# Patient Record
Sex: Male | Born: 1965 | Race: Black or African American | Hispanic: No | Smoking: Former smoker
Health system: Southern US, Community
[De-identification: ages and names within clinical notes are randomized; demographics above are authoritative.]

## PROBLEM LIST (undated history)

## (undated) DIAGNOSIS — E781 Pure hyperglyceridemia: Secondary | ICD-10-CM

## (undated) DIAGNOSIS — M1712 Unilateral primary osteoarthritis, left knee: Secondary | ICD-10-CM

## (undated) DIAGNOSIS — E785 Hyperlipidemia, unspecified: Secondary | ICD-10-CM

## (undated) HISTORY — DX: Pure hyperglyceridemia: E78.1

## (undated) HISTORY — PX: ACHILLES TENDON REPAIR: SUR1153

## (undated) HISTORY — DX: Hyperlipidemia, unspecified: E78.5

## (undated) HISTORY — PX: APPENDECTOMY: SHX54

## (undated) HISTORY — DX: Unilateral primary osteoarthritis, left knee: M17.12

## (undated) HISTORY — PX: HERNIA REPAIR: SHX51

---

## 2000-05-24 ENCOUNTER — Emergency Department (HOSPITAL_COMMUNITY): Admission: EM | Admit: 2000-05-24 | Discharge: 2000-05-25 | Payer: Self-pay | Admitting: Emergency Medicine

## 2007-02-15 ENCOUNTER — Ambulatory Visit: Payer: Self-pay | Admitting: Internal Medicine

## 2007-02-15 LAB — CONVERTED CEMR LAB
AST: 25 units/L (ref 0–37)
Basophils Absolute: 0 10*3/uL (ref 0.0–0.1)
Basophils Relative: 0.6 % (ref 0.0–1.0)
Bilirubin Urine: NEGATIVE
Bilirubin, Direct: 0.1 mg/dL (ref 0.0–0.3)
CO2: 30 meq/L (ref 19–32)
Cholesterol: 165 mg/dL (ref 0–200)
Creatinine, Ser: 1.4 mg/dL (ref 0.4–1.5)
Crystals: NEGATIVE
Glucose, Bld: 102 mg/dL — ABNORMAL HIGH (ref 70–99)
HDL: 32.7 mg/dL — ABNORMAL LOW (ref >39.0)
Hemoglobin: 14.2 g/dL (ref 13.0–17.0)
Lymphocytes Relative: 37.4 % (ref 12.0–46.0)
MCHC: 34.8 g/dL (ref 30.0–36.0)
Monocytes Absolute: 0.3 10*3/uL (ref 0.2–0.7)
Monocytes Relative: 6.6 % (ref 3.0–11.0)
Neutro Abs: 2.1 10*3/uL (ref 1.4–7.7)
Nitrite: NEGATIVE
PSA: 0.51 ng/mL (ref 0.10–4.00)
Potassium: 3.9 meq/L (ref 3.5–5.1)
Sodium: 139 meq/L (ref 135–145)
Specific Gravity, Urine: 1.025 (ref 1.000–1.03)
TSH: 0.97 microintl units/mL (ref 0.35–5.50)
Total Bilirubin: 1.3 mg/dL — ABNORMAL HIGH (ref 0.3–1.2)
Total Protein, Urine: NEGATIVE mg/dL
Total Protein: 7.4 g/dL (ref 6.0–8.3)
Urine Glucose: NEGATIVE mg/dL
Urobilinogen, UA: 0.2 (ref 0.0–1.0)

## 2007-02-22 ENCOUNTER — Ambulatory Visit: Payer: Self-pay | Admitting: Internal Medicine

## 2008-02-19 ENCOUNTER — Ambulatory Visit: Payer: Self-pay | Admitting: Internal Medicine

## 2009-04-18 ENCOUNTER — Ambulatory Visit: Payer: Self-pay | Admitting: Internal Medicine

## 2009-04-18 DIAGNOSIS — M25569 Pain in unspecified knee: Secondary | ICD-10-CM | POA: Insufficient documentation

## 2009-04-22 ENCOUNTER — Encounter: Payer: Self-pay | Admitting: Internal Medicine

## 2009-05-02 ENCOUNTER — Telehealth (INDEPENDENT_AMBULATORY_CARE_PROVIDER_SITE_OTHER): Payer: Self-pay | Admitting: *Deleted

## 2009-05-07 ENCOUNTER — Ambulatory Visit: Payer: Self-pay | Admitting: Internal Medicine

## 2009-05-07 LAB — CONVERTED CEMR LAB: Creatinine, Ser: 1.5 mg/dL (ref 0.4–1.5)

## 2010-01-19 ENCOUNTER — Ambulatory Visit: Payer: Self-pay | Admitting: Internal Medicine

## 2010-02-17 ENCOUNTER — Encounter: Payer: Self-pay | Admitting: Internal Medicine

## 2010-08-30 LAB — CONVERTED CEMR LAB
ALT: 37 units/L (ref 0–53)
AST: 25 units/L (ref 0–37)
Albumin: 4.1 g/dL (ref 3.5–5.2)
Albumin: 4.3 g/dL (ref 3.5–5.2)
Alkaline Phosphatase: 59 units/L (ref 39–117)
Alkaline Phosphatase: 68 units/L (ref 39–117)
BUN: 10 mg/dL (ref 6–23)
BUN: 15 mg/dL (ref 6–23)
Bacteria, UA: NEGATIVE
Basophils Absolute: 0 10*3/uL (ref 0.0–0.1)
Basophils Relative: 0.7 % (ref 0.0–3.0)
Bilirubin Urine: NEGATIVE
CO2: 28 meq/L (ref 19–32)
Calcium: 9.3 mg/dL (ref 8.4–10.5)
Chloride: 106 meq/L (ref 96–112)
Cholesterol: 159 mg/dL (ref 0–200)
Creatinine, Ser: 1.5 mg/dL (ref 0.4–1.5)
Creatinine, Ser: 1.6 mg/dL — ABNORMAL HIGH (ref 0.4–1.5)
Direct LDL: 84.9 mg/dL
Eosinophils Absolute: 0.2 10*3/uL (ref 0.0–0.7)
Eosinophils Absolute: 0.2 10*3/uL (ref 0.0–0.7)
Eosinophils Relative: 4.9 % (ref 0.0–5.0)
GFR calc non Af Amer: 61.52 mL/min (ref >60)
Glucose, Bld: 111 mg/dL — ABNORMAL HIGH (ref 70–99)
Glucose, Bld: 87 mg/dL (ref 70–99)
HCT: 42.2 % (ref 39.0–52.0)
HDL: 29.6 mg/dL — ABNORMAL LOW (ref >39.00)
Hemoglobin: 14.9 g/dL (ref 13.0–17.0)
Ketones, ur: NEGATIVE mg/dL
Lymphocytes Relative: 38.4 % (ref 12.0–46.0)
MCHC: 34.4 g/dL (ref 30.0–36.0)
MCV: 89.9 fL (ref 78.0–100.0)
Monocytes Absolute: 0.3 10*3/uL (ref 0.1–1.0)
Monocytes Relative: 6.4 % (ref 3.0–12.0)
Monocytes Relative: 6.7 % (ref 3.0–12.0)
Mucus, UA: NEGATIVE
Neutro Abs: 2.5 10*3/uL (ref 1.4–7.7)
Neutrophils Relative %: 45.7 % (ref 43.0–77.0)
Nitrite: NEGATIVE
PSA: 0.55 ng/mL (ref 0.10–4.00)
Platelets: 116 10*3/uL — ABNORMAL LOW (ref 150.0–400.0)
Platelets: 126 10*3/uL — ABNORMAL LOW (ref 150–400)
Potassium: 4.2 meq/L (ref 3.5–5.1)
RBC: 4.7 M/uL (ref 4.22–5.81)
RDW: 13 % (ref 11.5–14.6)
Sodium: 141 meq/L (ref 135–145)
Specific Gravity, Urine: 1.02 (ref 1.000–1.03)
Total Bilirubin: 1 mg/dL (ref 0.3–1.2)
Total CHOL/HDL Ratio: 5
Total Protein, Urine: NEGATIVE mg/dL
Total Protein, Urine: NEGATIVE mg/dL
Total Protein: 7 g/dL (ref 6.0–8.3)
Urine Glucose: NEGATIVE mg/dL
VLDL: 49.8 mg/dL — ABNORMAL HIGH (ref 0.0–40.0)
WBC: 4.6 10*3/uL (ref 4.5–10.5)
pH: 6 (ref 5.0–8.0)

## 2010-09-01 NOTE — Assessment & Plan Note (Signed)
Summary: left knee pain/cd   Vital Signs:  Patient profile:   45 year old male Height:      72 inches Weight:      251.25 pounds BMI:     34.20 O2 Sat:      96 % on Room air Temp:     98.7 degrees F oral Pulse rate:   84 / minute BP sitting:   122 / 80  (left arm) Cuff size:   large  Vitals Entered ByMarland Kitchen Zella Ball Ewing (January 19, 2010 3:40 PM)  O2 Flow:  Room air CC: Left Knee pain/RE   CC:  Left Knee pain/RE.  History of Present Illness: here with 2 months onset mod to severe gradually worsening left knee pain, sweling and click occasionally;  started after playing football with son;  no fever, trauma, hx of gout, fall ;  Pt denies CP, sob, doe, wheezing, orthopnea, pnd, worsening LE edema, palps, dizziness or syncope   Pt denies new neuro symptoms such as headache, facial or extremity weakness  No prior hx of left knee problem, pain, sweling or surgury in past  Problems Prior to Update: 1)  Tb Skin Test, Positive  (ICD-795.5) 2)  Knee Pain, Left  (ICD-719.46) 3)  Preventive Health Care  (ICD-V70.0)  Medications Prior to Update: 1)  None  Current Medications (verified): 1)  Tramadol Hcl 50 Mg Tabs (Tramadol Hcl) .Marland Kitchen.. 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   Head:  normocephalic and atraumatic.   Eyes:  vision grossly intact, pupils equal, and pupils round.     Ears:  R ear normal and L ear normal.   Nose:  no external deformity and no nasal discharge.   Mouth:  no gingival abnormalities and pharynx pink and moist.   Neck:  supple and no masses.   Lungs:  normal respiratory effort and normal breath sounds.   Heart:  normal rate and regular rhythm.   Msk:  right knee FROM, NT and no efusion;  left knee with 2-3+ effusion, tender anteriorly and medially with click on ROM Extremities:  no edema, no erythema  Neurologic:  strength normal in all extremities and gait normal.  except somewhat favors the left knee   Impression & Recommendations:  Problem # 1:  KNEE PAIN, LEFT (ICD-719.46)  with effusion, persistent and worsening pain now mod to severe , with click occasional - suspect cartilage tear; for tramadol as needed, MRI left knee, and refer ortho -  Orders: Orthopedic Surgeon Referral (Ortho Surgeon) Radiology Referral (Radiology)  His updated medication list for this problem includes:    Tramadol Hcl 50 Mg Tabs (Tramadol hcl) .Marland Kitchen... 1-2 by mouth q 6 hrs as needed pain  Complete Medication List: 1)  Tramadol Hcl 50 Mg Tabs (Tramadol hcl) .Marland Kitchen.. 1-2 by mouth q 6 hrs as needed pain  Patient Instructions: 1)  Please take all new medications as prescribed 2)  Continue all previous medications as before this visit  3)  You will be contacted about the referral(s) to: MRI and orthopedic evaluations 4)  Please schedule a follow-up appointment as needed. Prescriptions: TRAMADOL HCL 50 MG TABS (TRAMADOL HCL) 1-2 by mouth q 6 hrs as needed pain  #60 x 2   Entered and Authorized by:   Corwin Levins MD   Signed by:   Corwin Levins MD on 01/19/2010   Method used:   Print then Give to Patient   RxID:   [id number]

## 2010-09-01 NOTE — Letter (Signed)
Summary: [facility]  [facility]   Imported By: Sherian Rein 02/26/2010 09:05:22  _____________________________________________________________________  External Attachment:    Type:   Image     Comment:   External Document

## 2011-11-22 ENCOUNTER — Ambulatory Visit (INDEPENDENT_AMBULATORY_CARE_PROVIDER_SITE_OTHER): Payer: 59 | Admitting: Internal Medicine

## 2011-11-22 ENCOUNTER — Other Ambulatory Visit (INDEPENDENT_AMBULATORY_CARE_PROVIDER_SITE_OTHER): Payer: 59

## 2011-11-22 ENCOUNTER — Encounter: Payer: Self-pay | Admitting: Internal Medicine

## 2011-11-22 VITALS — BP 102/62 | HR 73 | Temp 97.8°F | Ht 71.5 in | Wt 254.4 lb

## 2011-11-22 DIAGNOSIS — R350 Frequency of micturition: Secondary | ICD-10-CM

## 2011-11-22 DIAGNOSIS — Z0000 Encounter for general adult medical examination without abnormal findings: Secondary | ICD-10-CM

## 2011-11-22 DIAGNOSIS — A64 Unspecified sexually transmitted disease: Secondary | ICD-10-CM

## 2011-11-22 DIAGNOSIS — A6000 Herpesviral infection of urogenital system, unspecified: Secondary | ICD-10-CM | POA: Insufficient documentation

## 2011-11-22 DIAGNOSIS — E781 Pure hyperglyceridemia: Secondary | ICD-10-CM

## 2011-11-22 DIAGNOSIS — M1712 Unilateral primary osteoarthritis, left knee: Secondary | ICD-10-CM

## 2011-11-22 HISTORY — DX: Pure hyperglyceridemia: E78.1

## 2011-11-22 HISTORY — DX: Unilateral primary osteoarthritis, left knee: M17.12

## 2011-11-22 LAB — CBC WITH DIFFERENTIAL/PLATELET
Basophils Absolute: 0 10*3/uL (ref 0.0–0.1)
Eosinophils Absolute: 0.2 10*3/uL (ref 0.0–0.7)
Hemoglobin: 14.9 g/dL (ref 13.0–17.0)
Lymphocytes Relative: 39 % (ref 12.0–46.0)
MCHC: 33.8 g/dL (ref 30.0–36.0)
Monocytes Relative: 7.8 % (ref 3.0–12.0)
Neutro Abs: 2.1 10*3/uL (ref 1.4–7.7)
Neutrophils Relative %: 48.9 % (ref 43.0–77.0)
RBC: 4.86 Mil/uL (ref 4.22–5.81)
RDW: 13.6 % (ref 11.5–14.6)

## 2011-11-22 LAB — URINALYSIS, ROUTINE W REFLEX MICROSCOPIC
Leukocytes, UA: NEGATIVE
Nitrite: NEGATIVE
Specific Gravity, Urine: 1.02 (ref 1.000–1.030)
Urobilinogen, UA: 0.2 (ref 0.0–1.0)

## 2011-11-22 MED ORDER — CIPROFLOXACIN HCL 500 MG PO TABS: 500.0000 mg | ORAL_TABLET | Freq: Two times a day (BID) | ORAL | Status: AC

## 2011-11-22 NOTE — Assessment & Plan Note (Signed)
By hx has minor penile d/c;  For routine labs and urine cx; overall I suspect prostatitis though DRE exam not overwhelming with his urinary freq - for cipro course, consider urology if nor improved

## 2011-11-22 NOTE — Progress Notes (Signed)
Subjective:    Patient ID: Ryan Dean, male    DOB: 01/07/1966, 46 y.o.   MRN: [id number]  HPI  Here for wellness and f/u;  Overall doing ok;  Pt denies CP, worsening SOB, DOE, wheezing, orthopnea, PND, worsening LE edema, palpitations, dizziness or syncope.  Pt denies neurological change such as new Headache, facial or extremity weakness.  Pt denies polydipsia, polyuria, or low sugar symptoms. Pt states overall good compliance with treatment and medications, good tolerability, and trying to follow lower cholesterol diet.  Pt denies worsening depressive symptoms, suicidal ideation or panic. No fever, wt loss, night sweats, loss of appetite, or other constitutional symptoms.  Pt states good ability with ADL's, low fall risk, home safety reviewed and adequate, no significant changes in hearing or vision, and occasionally active with exercise.  Has ongoing left knee pain with recurrent swelling only after exercise, o/w able to work, and declines ortho.  Also with minor penile d/c at end urination assoc with unsusual LBP and urinary freq for 2 wks.   Past Medical History  Diagnosis Date  . Hypertriglyceridemia 11/22/2011  . Degenerative arthritis of left knee 11/22/2011   Past Surgical History  Procedure Date  . Achilles tendon repair     left, 2004  . Appendectomy   . Hernia repair     umbilical as child    reports that he has never smoked. He has never used smokeless tobacco. He reports that he drinks alcohol. He reports that he does not use illicit drugs. family history includes Cancer in his father. No Known Allergies No current outpatient prescriptions on file prior to visit.    Review of Systems Review of Systems  Constitutional: Negative for diaphoresis, activity change, appetite change and unexpected weight change.  HENT: Negative for hearing loss, ear pain, facial swelling, mouth sores and neck stiffness.   Eyes: Negative for pain, redness and visual disturbance.  Respiratory:  Negative for shortness of breath and wheezing.   Cardiovascular: Negative for chest pain and palpitations.  Gastrointestinal: Negative for diarrhea, blood in stool, abdominal distention and rectal pain.  Genitourinary: Negative for hematuria, flank pain and decreased urine volume.  Musculoskeletal: Negative for myalgias and joint swelling.  Skin: Negative for color change and wound.  Neurological: Negative for syncope and numbness.  Hematological: Negative for adenopathy.  Psychiatric/Behavioral: Negative for hallucinations, self-injury, decreased concentration and agitation.      Objective:   Physical Exam BP 102/62  Pulse 73  Temp(Src) 97.8 F (36.6 C) (Oral)  Ht 5' 11.5" (1.816 m)  Wt 254 lb 6 oz (115.384 kg)  BMI 34.98 kg/m2  SpO2 97% Physical Exam  VS noted Constitutional: Pt is oriented to person, place, and time. Appears well-developed and well-nourished.  HENT:  Head: Normocephalic and atraumatic.  Right Ear: External ear normal.  Left Ear: External ear normal.  Nose: Nose normal.  Mouth/Throat: Oropharynx is clear and moist.  Eyes: Conjunctivae and EOM are normal. Pupils are equal, round, and reactive to light.  Neck: Normal range of motion. Neck supple. No JVD present. No tracheal deviation present.  Cardiovascular: Normal rate, regular rhythm, normal heart sounds and intact distal pulses.   Pulmonary/Chest: Effort normal and breath sounds normal.  Abdominal: Soft. Bowel sounds are normal. There is no tenderness.  Musculoskeletal: Normal range of motion. Exhibits no edema.  Lymphadenopathy:  Has no cervical adenopathy.  Neurological: Pt is alert and oriented to person, place, and time. Pt has normal reflexes. No cranial nerve deficit.  Skin:  Skin is warm and dry. No rash noted.  Psychiatric:  Has  normal mood and affect. Behavior is normal.  DRE:  Mild tender prostate, not enlarged, no nodule , no rectal mass, heme neg Spine nontender    Assessment & Plan:

## 2011-11-22 NOTE — Patient Instructions (Signed)
Take all new medications as prescribed Continue all other medications as before Please go to LAB in the Basement for the blood and/or urine tests to be done today You will be contacted by phone if any changes need to be made immediately.  Otherwise, you will receive a letter about your results with an explanation. Please return in 1 year for your yearly visit, or sooner if needed, with Lab testing done 3-5 days before

## 2011-11-22 NOTE — Assessment & Plan Note (Signed)
?   Etiology - suspect prostatitis, for cipro course, urine studies

## 2011-11-22 NOTE — Assessment & Plan Note (Signed)

## 2011-11-23 ENCOUNTER — Encounter: Payer: Self-pay | Admitting: Internal Medicine

## 2011-11-23 LAB — BASIC METABOLIC PANEL
CO2: 30 mEq/L (ref 19–32)
Calcium: 10.3 mg/dL (ref 8.4–10.5)
Chloride: 105 mEq/L (ref 96–112)
Creatinine, Ser: 1.5 mg/dL (ref 0.4–1.5)
Glucose, Bld: 86 mg/dL (ref 70–99)

## 2011-11-23 LAB — HEPATIC FUNCTION PANEL
ALT: 27 U/L (ref 0–53)
Albumin: 4.4 g/dL (ref 3.5–5.2)
Alkaline Phosphatase: 59 U/L (ref 39–117)
Bilirubin, Direct: 0.1 mg/dL (ref 0.0–0.3)
Total Protein: 7.6 g/dL (ref 6.0–8.3)

## 2011-11-23 LAB — HIV ANTIBODY (ROUTINE TESTING W REFLEX): HIV: NONREACTIVE

## 2011-11-23 LAB — LIPID PANEL
HDL: 33.7 mg/dL — ABNORMAL LOW (ref >39.00)
Triglycerides: 197 mg/dL — ABNORMAL HIGH (ref 0.0–149.0)

## 2011-11-23 LAB — GC/CHLAMYDIA PROBE AMP, URINE: GC Probe Amp, Urine: NEGATIVE

## 2011-11-23 LAB — HEPATITIS PANEL, ACUTE
HCV Ab: NEGATIVE
Hep A IgM: NEGATIVE
Hepatitis B Surface Ag: NEGATIVE

## 2011-11-23 LAB — HSV 2 ANTIBODY, IGG: HSV 2 Glycoprotein G Ab, IgG: 9 IV — ABNORMAL HIGH

## 2011-11-24 ENCOUNTER — Telehealth: Payer: Self-pay | Admitting: Internal Medicine

## 2011-11-24 LAB — URINE CULTURE: Colony Count: NO GROWTH

## 2011-11-24 MED ORDER — VALACYCLOVIR HCL 500 MG PO TABS: 500.0000 mg | ORAL_TABLET | Freq: Every day | ORAL | Status: AC

## 2011-11-24 NOTE — Telephone Encounter (Signed)
Message copied by Corwin Levins on Wed Nov 24, 2011 11:58 AM ------      Message from: Scharlene Gloss B      Created: Wed Nov 24, 2011  8:39 AM       Called the patient informed of results. The patient would like the medication for valtrex sent to [facility] [place] [address] please.

## 2011-11-24 NOTE — Telephone Encounter (Signed)
Done per emr 

## 2013-08-30 ENCOUNTER — Encounter: Payer: 59 | Admitting: Internal Medicine

## 2014-02-06 ENCOUNTER — Ambulatory Visit: Payer: 59 | Admitting: Internal Medicine

## 2014-02-22 ENCOUNTER — Ambulatory Visit (INDEPENDENT_AMBULATORY_CARE_PROVIDER_SITE_OTHER): Payer: 59 | Admitting: Internal Medicine

## 2014-02-22 ENCOUNTER — Ambulatory Visit (INDEPENDENT_AMBULATORY_CARE_PROVIDER_SITE_OTHER)
Admission: RE | Admit: 2014-02-22 | Discharge: 2014-02-22 | Disposition: A | Discharge status: Home / Self Care | Payer: 59 | Source: Ambulatory Visit | Attending: Internal Medicine | Admitting: Internal Medicine

## 2014-02-22 ENCOUNTER — Encounter: Payer: Self-pay | Admitting: Internal Medicine

## 2014-02-22 ENCOUNTER — Other Ambulatory Visit (INDEPENDENT_AMBULATORY_CARE_PROVIDER_SITE_OTHER): Payer: 59

## 2014-02-22 VITALS — BP 128/82 | HR 60 | Temp 98.7°F | Ht 71.5 in | Wt 241.2 lb

## 2014-02-22 DIAGNOSIS — R0789 Other chest pain: Secondary | ICD-10-CM

## 2014-02-22 DIAGNOSIS — Z0000 Encounter for general adult medical examination without abnormal findings: Secondary | ICD-10-CM

## 2014-02-22 DIAGNOSIS — R079 Chest pain, unspecified: Secondary | ICD-10-CM | POA: Insufficient documentation

## 2014-02-22 DIAGNOSIS — E785 Hyperlipidemia, unspecified: Secondary | ICD-10-CM

## 2014-02-22 LAB — CBC WITH DIFFERENTIAL/PLATELET
BASOS ABS: 0 10*3/uL (ref 0.0–0.1)
Basophils Relative: 0.4 % (ref 0.0–3.0)
EOS ABS: 0.2 10*3/uL (ref 0.0–0.7)
Eosinophils Relative: 4.3 % (ref 0.0–5.0)
HEMATOCRIT: 41.7 % (ref 39.0–52.0)
Hemoglobin: 14.4 g/dL (ref 13.0–17.0)
LYMPHS ABS: 1.7 10*3/uL (ref 0.7–4.0)
Lymphocytes Relative: 34.2 % (ref 12.0–46.0)
MCHC: 34.6 g/dL (ref 30.0–36.0)
MCV: 89.1 fl (ref 78.0–100.0)
MONO ABS: 0.3 10*3/uL (ref 0.1–1.0)
Monocytes Relative: 6.7 % (ref 3.0–12.0)
NEUTROS PCT: 54.4 % (ref 43.0–77.0)
Neutro Abs: 2.7 10*3/uL (ref 1.4–7.7)
PLATELETS: 140 10*3/uL — AB (ref 150.0–400.0)
RBC: 4.68 Mil/uL (ref 4.22–5.81)
RDW: 13.9 % (ref 11.5–15.5)
WBC: 5 10*3/uL (ref 4.0–10.5)

## 2014-02-22 LAB — URINALYSIS, ROUTINE W REFLEX MICROSCOPIC
BILIRUBIN URINE: NEGATIVE
Hgb urine dipstick: NEGATIVE
Leukocytes, UA: NEGATIVE
NITRITE: NEGATIVE
Specific Gravity, Urine: 1.015 (ref 1.000–1.030)
TOTAL PROTEIN, URINE-UPE24: NEGATIVE
Urine Glucose: NEGATIVE
Urobilinogen, UA: 0.2 (ref 0.0–1.0)
pH: 7.5 (ref 5.0–8.0)

## 2014-02-22 LAB — BASIC METABOLIC PANEL
BUN: 13 mg/dL (ref 6–23)
CO2: 25 mEq/L (ref 19–32)
Calcium: 9.3 mg/dL (ref 8.4–10.5)
Chloride: 105 mEq/L (ref 96–112)
Creatinine, Ser: 1.5 mg/dL (ref 0.4–1.5)
GFR: 66.32 mL/min (ref >60.00)
Glucose, Bld: 88 mg/dL (ref 70–99)
POTASSIUM: 3.8 meq/L (ref 3.5–5.1)
SODIUM: 137 meq/L (ref 135–145)

## 2014-02-22 LAB — LIPID PANEL
CHOL/HDL RATIO: 5
Cholesterol: 146 mg/dL (ref 0–200)
HDL: 31.7 mg/dL — ABNORMAL LOW (ref >39.00)
LDL CALC: 75 mg/dL (ref 0–99)
NonHDL: 114.3
TRIGLYCERIDES: 195 mg/dL — AB (ref 0.0–149.0)
VLDL: 39 mg/dL (ref 0.0–40.0)

## 2014-02-22 LAB — HEPATIC FUNCTION PANEL
ALK PHOS: 67 U/L (ref 39–117)
ALT: 26 U/L (ref 0–53)
AST: 25 U/L (ref 0–37)
Albumin: 4.3 g/dL (ref 3.5–5.2)
BILIRUBIN DIRECT: 0.1 mg/dL (ref 0.0–0.3)
BILIRUBIN TOTAL: 0.7 mg/dL (ref 0.2–1.2)
Total Protein: 7.2 g/dL (ref 6.0–8.3)

## 2014-02-22 NOTE — Progress Notes (Signed)
Subjective:    Patient ID: Ryan Dean, male    DOB: 05-Nov-1965, 48 y.o.   MRN: [id number]  HPI  Here after lost to f/u since 2013, Here for wellness and f/u;  Overall doing ok;  Pt denies worsening SOB, DOE, wheezing, orthopnea, PND, worsening LE edema, palpitations, dizziness or syncope, but has been having lower mid chest discomfort, sometimes sharp, sometimes dull or burning, last 1-2 min each time, recurrent several times per week for 2 mo.  Has been lifting wts and o/w more active since 2013.  Also does pushups most days of the week. Smokes occas cigars, has hx of very mild elev ldl, but no DM, HTN,    Pt denies neurological change such as new headache, facial or extremity weakness.  Pt denies polydipsia, polyuria, or low sugar symptoms. Pt states overall good compliance with treatment and medications, good tolerability, and has been trying to follow lower cholesterol diet.  Pt denies worsening depressive symptoms, suicidal ideation or panic. No fever, night sweats, wt loss, loss of appetite, or other constitutional symptoms.  Pt states good ability with ADL's, has low fall risk, home safety reviewed and adequate, no other significant changes in hearing or vision, and only occasionally active with exercise. Past Medical History  Diagnosis Date  . Hypertriglyceridemia 11/22/2011  . 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 Achilles tendon repair      left, 2004  . Appendectomy    . Hernia repair      umbilical as child    reports that he has never smoked. He has never used smokeless tobacco. He reports that he drinks alcohol. He reports that he does not use illicit drugs. family history includes Cancer in his father. No Known Allergies No current outpatient prescriptions on file prior to visit.   No current facility-administered medications on file prior to visit.   Review of Systems Constitutional: Negative for increased  diaphoresis, other activity, appetite or other siginficant weight change  HENT: Negative for worsening hearing loss, ear pain, facial swelling, mouth sores and neck stiffness.   Eyes: Negative for other worsening pain, redness or visual disturbance.  Respiratory: Negative for shortness of breath and wheezing.   Cardiovascular: Negative for chest pain and palpitations.  Gastrointestinal: Negative for diarrhea, blood in stool, abdominal distention or other pain Genitourinary: Negative for hematuria, flank pain or change in urine volume.  Musculoskeletal: Negative for myalgias or other joint complaints.  Skin: Negative for color change and wound.  Neurological: Negative for syncope and numbness. other than noted Hematological: Negative for adenopathy. or other swelling Psychiatric/Behavioral: Negative for hallucinations, self-injury, decreased concentration or other worsening agitation.      Objective:   Physical Exam BP 128/82  Pulse 60  Temp(Src) 98.7 F (37.1 C) (Oral)  Ht 5' 11.5" (1.816 m)  Wt 241 lb 4 oz (109.43 kg)  BMI 33.18 kg/m2  SpO2 98% VS noted,  Constitutional: Pt is oriented to person, place, and time. Appears well-developed and well-nourished.  Head: Normocephalic and atraumatic.  Right Ear: External ear normal.  Left Ear: External ear normal.  Nose: Nose normal.  Mouth/Throat: Oropharynx is clear and moist.  Eyes: Conjunctivae and EOM are normal. Pupils are equal, round, and reactive to light.  Neck: Normal range of motion. Neck supple. No JVD present. No tracheal deviation present.  Cardiovascular: Normal rate, regular rhythm, normal heart sounds and intact distal pulses.   Pulmonary/Chest: Effort normal and  breath sounds without rales or wheezing  No chest wall or sternal tenderness. Abdominal: Soft. Bowel sounds are normal. NT. No HSM  Musculoskeletal: Normal range of motion. Exhibits no edema.  Lymphadenopathy:  Has no cervical adenopathy.  Neurological: Pt is  alert and oriented to person, place, and time. Pt has normal reflexes. No cranial nerve deficit. Motor grossly intact Skin: Skin is warm and dry. No rash noted.  Psychiatric:  Has normal mood and affect. Behavior is normal.     Assessment & Plan:

## 2014-02-22 NOTE — Progress Notes (Signed)
Pre visit review using our clinic review tool, if applicable. No additional management support is needed unless otherwise documented below in the visit note. 

## 2014-02-22 NOTE — Patient Instructions (Signed)
Your EKG was OK today  Please continue all other medications as before, and refills have been done if requested.  Please have the pharmacy call with any other refills you may need.  Please continue your efforts at being more active, low cholesterol diet, and weight control.  You are otherwise up to date with prevention measures today.  Please keep your appointments with your specialists as you may have planned  Please go to the XRAY Department in the Basement (go straight as you get off the elevator) for the x-ray testing  Please go to the LAB in the Basement (turn left off the elevator) for the tests to be done today  You will be contacted regarding the referral for: stress test  Please return in 1 year for your yearly visit, or sooner if needed

## 2014-02-24 ENCOUNTER — Encounter: Payer: Self-pay | Admitting: Internal Medicine

## 2014-02-24 DIAGNOSIS — E785 Hyperlipidemia, unspecified: Secondary | ICD-10-CM | POA: Insufficient documentation

## 2014-02-24 HISTORY — DX: Hyperlipidemia, unspecified: E78.5

## 2014-02-24 NOTE — Assessment & Plan Note (Signed)
Atypical, ECG reviewed as per emr, for cxr, also stress test given age and CRF's

## 2014-02-24 NOTE — Assessment & Plan Note (Signed)
Declines statin for now, for lower chol diet 

## 2014-02-24 NOTE — Assessment & Plan Note (Signed)

## 2014-02-25 LAB — TSH: TSH: 1.2 u[IU]/mL (ref 0.35–4.50)

## 2014-02-25 LAB — PSA: PSA: 0.86 ng/mL (ref 0.10–4.00)

## 2014-02-26 ENCOUNTER — Encounter: Payer: Self-pay | Admitting: Internal Medicine

## 2014-04-03 ENCOUNTER — Ambulatory Visit (HOSPITAL_COMMUNITY): Payer: 59 | Attending: Internal Medicine | Admitting: Radiology

## 2014-04-03 VITALS — BP 125/95 | HR 63 | Ht 71.0 in | Wt 235.0 lb

## 2014-04-03 DIAGNOSIS — R42 Dizziness and giddiness: Secondary | ICD-10-CM | POA: Diagnosis not present

## 2014-04-03 DIAGNOSIS — R9389 Abnormal findings on diagnostic imaging of other specified body structures: Secondary | ICD-10-CM | POA: Diagnosis not present

## 2014-04-03 DIAGNOSIS — R0789 Other chest pain: Secondary | ICD-10-CM | POA: Insufficient documentation

## 2014-04-03 DIAGNOSIS — R030 Elevated blood-pressure reading, without diagnosis of hypertension: Secondary | ICD-10-CM | POA: Insufficient documentation

## 2014-04-03 MED ORDER — TECHNETIUM TC 99M SESTAMIBI GENERIC - CARDIOLITE
11.0000 | Freq: Once | INTRAVENOUS | Status: AC | PRN
  Administered 2014-04-03: 11 via INTRAVENOUS

## 2014-04-03 MED ORDER — TECHNETIUM TC 99M SESTAMIBI GENERIC - CARDIOLITE
33.0000 | Freq: Once | INTRAVENOUS | Status: AC | PRN
  Administered 2014-04-03: 33 via INTRAVENOUS

## 2014-04-03 NOTE — Progress Notes (Signed)
[facility] SITE 3 NUCLEAR MED [address] [place], [place] [address] [phone]    Cardiology Nuclear Med Study  Rusty Villella is a 48 y.o. male     MRN : [id number]     DOB: [birth year]  Procedure Date: 04/03/2014  Nuclear Med Background Indication for Stress Test:  Evaluation for Ischemia History:  No known CAD Cardiac Risk Factors: Family History - CAD and Lipids  Symptoms:  Chest Pain (last date of chest discomfort was last night) and Dizziness   Nuclear Pre-Procedure Caffeine/Decaff Intake:  None> 12 hrs NPO After: 11:00pm   Lungs:  clear O2 Sat: 96% on room air. IV 0.9% NS with Angio Cath:  20g  IV Site: R Antecubital x 1, tolerated well IV Started by:  Irean Hong, RN  Chest Size (in):  46 Cup Size: n/a  Height:  (1.803 m)  Weight:  235 lb (106.595 kg)  BMI:  Body mass index is 32.79 kg/(m^2). Tech Comments:  N/A    Nuclear Med Study 1 or 2 day study: 1 day  Stress Test Type:  Stress  Reading MD: N/A  Order Authorizing Provider:  Oliver Barre, MD  Resting Radionuclide: Technetium 33m Sestamibi  Resting Radionuclide Dose: 11.0 mCi   Stress Radionuclide:  Technetium 18m Sestamibi  Stress Radionuclide Dose: 33.0 mCi           Stress Protocol Rest HR: 63 Stress HR: 162  Rest BP: 125/95 Stress BP: 172/118  Exercise Time (min): 10:15 METS: 12.3           Dose of Adenosine (mg):  n/a Dose of Lexiscan: n/a mg  Dose of Atropine (mg): n/a Dose of Dobutamine: n/a mcg/kg/min (at max HR)  Stress Test Technologist: Nelson Chimes, BS-ES  Nuclear Technologist:  Harlow Asa, CNMT     Rest Procedure:  Myocardial perfusion imaging was performed at rest 46 minutes following the intravenous administration of Technetium 46m Sestamibi. Rest ECG: NSR - Normal EKG  Stress Procedure:  The patient exercised on the treadmill utilizing the Bruce Protocol for 10:15 minutes. The patient stopped due to fatigue and denied any chest pain.  Technetium 30m Sestamibi  was injected at peak exercise and myocardial perfusion imaging was performed after a brief delay. Stress ECG: No significant change from baseline ECG  QPS Raw Data Images:  Normal; no motion artifact; normal heart/lung ratio. Stress Images:  There is a small area of mild attenuation of the mid-basal anterior wall with normal uptake in the other areas.   Rest Images:  There is a very small area of very mild attenuation of the mid-basal anterior wall.   Subtraction (SDS):  Therer is a very small , very mild reversible area of reversible attenuation .  SDS = 3 Transient Ischemic Dilatation (Normal <1.22):  1.00 Lung/Heart Ratio (Normal <0.45):  0.29  Quantitative Gated Spect Images QGS EDV:  142 ml QGS ESV:  74 ml  Impression Exercise Capacity:  Excellent exercise capacity. BP Response:  Hypertensive blood pressure response. Clinical Symptoms:  No significant symptoms noted. ECG Impression:  No significant ST segment change suggestive of ischemia. Comparison with Prior Nuclear Study: No images to compare  Overall Impression:  Low risk stress nuclear study .  There is a very mild basal - mid anterior defect that may be due to chest wall artifact.  .  LV Ejection Fraction: 48%.  LV Wall Motion:  There is mild global LV hypokinesis.       Deloris Ping.  Fran Lowes., MD, [facility] 04/03/2014, 4:39 PM [address]  [address] Office [phone] Pager [phone]

## 2014-04-04 ENCOUNTER — Encounter: Payer: Self-pay | Admitting: Internal Medicine

## 2015-07-25 ENCOUNTER — Encounter: Payer: Self-pay | Admitting: Internal Medicine

## 2015-07-25 ENCOUNTER — Ambulatory Visit (INDEPENDENT_AMBULATORY_CARE_PROVIDER_SITE_OTHER): Payer: 59 | Admitting: Internal Medicine

## 2015-07-25 ENCOUNTER — Other Ambulatory Visit (INDEPENDENT_AMBULATORY_CARE_PROVIDER_SITE_OTHER): Payer: 59

## 2015-07-25 VITALS — BP 130/72 | HR 63 | Temp 97.8°F | Resp 12 | Ht 71.0 in | Wt 244.0 lb

## 2015-07-25 DIAGNOSIS — Z0000 Encounter for general adult medical examination without abnormal findings: Secondary | ICD-10-CM

## 2015-07-25 DIAGNOSIS — N529 Male erectile dysfunction, unspecified: Secondary | ICD-10-CM | POA: Diagnosis not present

## 2015-07-25 DIAGNOSIS — R7989 Other specified abnormal findings of blood chemistry: Secondary | ICD-10-CM | POA: Diagnosis not present

## 2015-07-25 LAB — CBC WITH DIFFERENTIAL/PLATELET
BASOS ABS: 0 10*3/uL (ref 0.0–0.1)
Basophils Relative: 0.7 % (ref 0.0–3.0)
EOS ABS: 0.2 10*3/uL (ref 0.0–0.7)
Eosinophils Relative: 4.8 % (ref 0.0–5.0)
HEMATOCRIT: 44.2 % (ref 39.0–52.0)
HEMOGLOBIN: 14.7 g/dL (ref 13.0–17.0)
LYMPHS PCT: 41.2 % (ref 12.0–46.0)
Lymphs Abs: 2 10*3/uL (ref 0.7–4.0)
MCHC: 33.3 g/dL (ref 30.0–36.0)
MCV: 89.5 fl (ref 78.0–100.0)
Monocytes Absolute: 0.4 10*3/uL (ref 0.1–1.0)
Monocytes Relative: 7.9 % (ref 3.0–12.0)
Neutro Abs: 2.2 10*3/uL (ref 1.4–7.7)
Neutrophils Relative %: 45.4 % (ref 43.0–77.0)
PLATELETS: 134 10*3/uL — AB (ref 150.0–400.0)
RBC: 4.94 Mil/uL (ref 4.22–5.81)
RDW: 13.8 % (ref 11.5–15.5)
WBC: 4.9 10*3/uL (ref 4.0–10.5)

## 2015-07-25 LAB — URINALYSIS, ROUTINE W REFLEX MICROSCOPIC
Bilirubin Urine: NEGATIVE
Ketones, ur: NEGATIVE
Leukocytes, UA: NEGATIVE
Nitrite: NEGATIVE
SPECIFIC GRAVITY, URINE: 1.025 (ref 1.000–1.030)
Total Protein, Urine: NEGATIVE
URINE GLUCOSE: NEGATIVE
Urobilinogen, UA: 0.2 (ref 0.0–1.0)
pH: 6 (ref 5.0–8.0)

## 2015-07-25 LAB — LDL CHOLESTEROL, DIRECT: Direct LDL: 99 mg/dL

## 2015-07-25 LAB — BASIC METABOLIC PANEL
BUN: 18 mg/dL (ref 6–23)
CHLORIDE: 106 meq/L (ref 96–112)
CO2: 28 meq/L (ref 19–32)
CREATININE: 1.41 mg/dL (ref 0.40–1.50)
Calcium: 9.4 mg/dL (ref 8.4–10.5)
GFR: 69.16 mL/min (ref >60.00)
Glucose, Bld: 88 mg/dL (ref 70–99)
Potassium: 3.8 mEq/L (ref 3.5–5.1)
Sodium: 140 mEq/L (ref 135–145)

## 2015-07-25 LAB — HEPATIC FUNCTION PANEL
ALBUMIN: 4.3 g/dL (ref 3.5–5.2)
ALT: 21 U/L (ref 0–53)
AST: 17 U/L (ref 0–37)
Alkaline Phosphatase: 71 U/L (ref 39–117)
Bilirubin, Direct: 0.1 mg/dL (ref 0.0–0.3)
TOTAL PROTEIN: 7 g/dL (ref 6.0–8.3)
Total Bilirubin: 0.6 mg/dL (ref 0.2–1.2)

## 2015-07-25 LAB — TESTOSTERONE: TESTOSTERONE: 315.27 ng/dL (ref 300.00–890.00)

## 2015-07-25 LAB — LIPID PANEL
CHOLESTEROL: 167 mg/dL (ref 0–200)
HDL: 34.1 mg/dL — ABNORMAL LOW (ref >39.00)
NonHDL: 132.98
TRIGLYCERIDES: 312 mg/dL — AB (ref 0.0–149.0)
Total CHOL/HDL Ratio: 5
VLDL: 62.4 mg/dL — ABNORMAL HIGH (ref 0.0–40.0)

## 2015-07-25 LAB — PSA: PSA: 0.83 ng/mL (ref 0.10–4.00)

## 2015-07-25 LAB — TSH: TSH: 0.88 u[IU]/mL (ref 0.35–4.50)

## 2015-07-25 MED ORDER — SILDENAFIL CITRATE 20 MG PO TABS: ORAL_TABLET | ORAL | Status: DC

## 2015-07-25 NOTE — Patient Instructions (Signed)
Please take all new medication as prescribed - the generic viagra  Please continue all other medications as before, and refills have been done if requested.  Please have the pharmacy call with any other refills you may need.  Please continue your efforts at being more active, low cholesterol diet, and weight control.  You are otherwise up to date with prevention measures today.  Please keep your appointments with your specialists as you may have planned  Please go to the LAB in the Basement (turn left off the elevator) for the tests to be done today  You will be contacted by phone if any changes need to be made immediately.  Otherwise, you will receive a letter about your results with an explanation, but please check with MyChart first.  Please remember to sign up for MyChart if you have not done so, as this will be important to you in the future with finding out test results, communicating by private email, and scheduling acute appointments online when needed.  Please return in 1 year for your yearly visit, or sooner if needed, with Lab testing done 3-5 days before

## 2015-07-25 NOTE — Progress Notes (Signed)
Subjective:    Patient ID: Ryan Dean, male    DOB: 02/03/1966, 49 y.o.   MRN: [id number]  HPI  Here for wellness and f/u;  Overall doing ok;  Pt denies Chest pain, worsening SOB, DOE, wheezing, orthopnea, PND, worsening LE edema, palpitations, dizziness or syncope.  Pt denies neurological change such as new headache, facial or extremity weakness.  Pt denies polydipsia, polyuria, or low sugar symptoms. Pt states overall good compliance with treatment and medications, good tolerability, and has been trying to follow appropriate diet.  Pt denies worsening depressive symptoms, suicidal ideation or panic. No fever, night sweats, wt loss, loss of appetite, or other constitutional symptoms.  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 Declines flu shot.  Does have worsening ED symtpoms over the past 6 mo Wt Readings from Last 3 Encounters:  07/25/15 244 lb (110.678 kg)  04/03/14 235 lb (106.595 kg)  02/22/14 241 lb 4 oz (109.43 kg)   Past Medical History  Diagnosis Date  . Hypertriglyceridemia 11/22/2011  . Degenerative arthritis of left knee 11/22/2011  . Dyslipidemia 02/24/2014   Past Surgical History  Procedure Laterality Date  . Achilles tendon repair      left, 2004  . Appendectomy    . Hernia repair      umbilical as child    reports that he has quit smoking. He has never used smokeless tobacco. He reports that he drinks about 3.6 oz of alcohol per week. He reports that he does not use illicit drugs. family history includes Cancer in his father. No Known Allergies No current outpatient prescriptions on file prior to visit.   No current facility-administered medications on file prior to visit.   Review of  Systems Constitutional: Negative for increased diaphoresis, other activity, appetite or siginficant weight change other than noted HENT: Negative for worsening hearing loss, ear pain, facial swelling, mouth sores and neck stiffness.   Eyes: Negative for other worsening pain, redness or visual disturbance.  Respiratory: Negative for shortness of breath and wheezing  Cardiovascular: Negative for chest pain and palpitations.  Gastrointestinal: Negative for diarrhea, blood in stool, abdominal distention or other pain Genitourinary: Negative for hematuria, flank pain or change in urine volume.  Musculoskeletal: Negative for myalgias or other joint complaints.  Skin: Negative for color change and wound or drainage.  Neurological: Negative for syncope and numbness. other than noted Hematological: Negative for adenopathy. or other swelling Psychiatric/Behavioral: Negative for hallucinations, SI, self-injury, decreased concentration or other worsening agitation.      Objective:   Physical Exam BP 130/72 mmHg  Pulse 63  Temp(Src) 97.8 F (36.6 C) (Oral)  Resp 12  Ht 5\' 11"  (1.803 m)  Wt 244 lb (110.678 kg)  BMI 34.05 kg/m2  SpO2 97% VS noted,  Constitutional: Pt is oriented to person, place, and time. Appears well-developed and well-nourished, in no significant distress Head: Normocephalic and atraumatic.  Right Ear: External ear normal.  Left Ear: External ear normal.  Nose: Nose normal.  Mouth/Throat: Oropharynx is clear and moist.  Eyes: Conjunctivae and EOM are normal. Pupils are equal, round, and reactive to light.  Neck: Normal range of motion. Neck supple. No JVD present. No tracheal deviation present or  significant neck LA or mass Cardiovascular: Normal rate, regular rhythm, normal heart sounds and intact distal pulses.   Pulmonary/Chest: Effort normal and breath sounds without rales or wheezing  Abdominal: Soft. Bowel sounds are normal. NT. No HSM  Musculoskeletal: Normal range  of motion. Exhibits no edema.  Lymphadenopathy:  Has no cervical adenopathy.  Neurological: Pt is alert and oriented to person, place, and time. Pt has normal reflexes. No cranial nerve deficit. Motor grossly intact Skin: Skin is warm and dry. No rash noted.  Psychiatric:  Has normal mood and affect. Behavior is normal.     Assessment & Plan:

## 2015-07-25 NOTE — Progress Notes (Signed)
Pre visit review using our clinic review tool, if applicable. No additional management support is needed unless otherwise documented below in the visit note. 

## 2015-08-01 ENCOUNTER — Telehealth: Payer: Self-pay

## 2015-08-01 NOTE — Telephone Encounter (Signed)
Ryan SheetsEric Grauberger (Key: [id number])  covermymeds

## 2015-08-14 ENCOUNTER — Telehealth: Payer: Self-pay

## 2015-08-14 NOTE — Telephone Encounter (Signed)
PA initiated via covermymeds. Key for PA is [id number]

## 2015-09-12 ENCOUNTER — Telehealth: Payer: Self-pay

## 2015-09-12 NOTE — Telephone Encounter (Signed)
PA initiated via [place], Key [phone]

## 2015-09-15 NOTE — Telephone Encounter (Signed)
PA denied, please advise on alternative medication. Thanks! 

## 2015-09-15 NOTE — Telephone Encounter (Signed)
Ok to let pt know that his insurance will not cover generic or brand name viagra, and therefore not likely to cover any of them, or accept a PA for this  I can offer any ED med he would want, including viagra, cialis, levitra or stendra, but all are brand name and not likely covered by insurance  He may wish to shop pharmacies such as [facility] or [facility] or [facility] for best pricing for any of these meds

## 2015-09-15 NOTE — Telephone Encounter (Signed)
Left message on machine for pt to return my call  

## 2015-09-16 NOTE — Telephone Encounter (Signed)
Left message on machine for pt to return my call, closing phone note until further contact from pt 

## 2015-10-13 ENCOUNTER — Ambulatory Visit (INDEPENDENT_AMBULATORY_CARE_PROVIDER_SITE_OTHER): Payer: 59 | Admitting: Internal Medicine

## 2015-10-13 ENCOUNTER — Encounter: Payer: Self-pay | Admitting: Internal Medicine

## 2015-10-13 ENCOUNTER — Other Ambulatory Visit (INDEPENDENT_AMBULATORY_CARE_PROVIDER_SITE_OTHER): Payer: 59

## 2015-10-13 VITALS — BP 130/70 | HR 83 | Temp 98.8°F | Resp 20 | Wt 240.0 lb

## 2015-10-13 DIAGNOSIS — K529 Noninfective gastroenteritis and colitis, unspecified: Secondary | ICD-10-CM

## 2015-10-13 LAB — URINALYSIS, ROUTINE W REFLEX MICROSCOPIC
LEUKOCYTES UA: NEGATIVE
NITRITE: NEGATIVE
Specific Gravity, Urine: 1.025 (ref 1.000–1.030)
TOTAL PROTEIN, URINE-UPE24: 30 — AB
URINE GLUCOSE: NEGATIVE
UROBILINOGEN UA: 0.2 (ref 0.0–1.0)
WBC, UA: NONE SEEN (ref 0–?)
pH: 6 (ref 5.0–8.0)

## 2015-10-13 LAB — CBC WITH DIFFERENTIAL/PLATELET
BASOS ABS: 0 10*3/uL (ref 0.0–0.1)
Basophils Relative: 0.4 % (ref 0.0–3.0)
EOS ABS: 0.2 10*3/uL (ref 0.0–0.7)
Eosinophils Relative: 2.7 % (ref 0.0–5.0)
HCT: 42.9 % (ref 39.0–52.0)
Hemoglobin: 14.8 g/dL (ref 13.0–17.0)
LYMPHS ABS: 1.7 10*3/uL (ref 0.7–4.0)
Lymphocytes Relative: 21.5 % (ref 12.0–46.0)
MCHC: 34.4 g/dL (ref 30.0–36.0)
MCV: 87.8 fl (ref 78.0–100.0)
MONO ABS: 0.7 10*3/uL (ref 0.1–1.0)
Monocytes Relative: 8.2 % (ref 3.0–12.0)
Neutro Abs: 5.4 10*3/uL (ref 1.4–7.7)
Neutrophils Relative %: 67.2 % (ref 43.0–77.0)
Platelets: 151 10*3/uL (ref 150.0–400.0)
RBC: 4.89 Mil/uL (ref 4.22–5.81)
RDW: 13.4 % (ref 11.5–15.5)
WBC: 8 10*3/uL (ref 4.0–10.5)

## 2015-10-13 LAB — HEPATIC FUNCTION PANEL
ALK PHOS: 61 U/L (ref 39–117)
ALT: 17 U/L (ref 0–53)
AST: 16 U/L (ref 0–37)
Albumin: 4.5 g/dL (ref 3.5–5.2)
Bilirubin, Direct: 0.2 mg/dL (ref 0.0–0.3)
TOTAL PROTEIN: 7.8 g/dL (ref 6.0–8.3)
Total Bilirubin: 1.5 mg/dL — ABNORMAL HIGH (ref 0.2–1.2)

## 2015-10-13 LAB — BASIC METABOLIC PANEL
BUN: 14 mg/dL (ref 6–23)
CHLORIDE: 104 meq/L (ref 96–112)
CO2: 26 meq/L (ref 19–32)
Calcium: 9.7 mg/dL (ref 8.4–10.5)
Creatinine, Ser: 1.69 mg/dL — ABNORMAL HIGH (ref 0.40–1.50)
GFR: 56.06 mL/min — AB (ref >60.00)
GLUCOSE: 97 mg/dL (ref 70–99)
POTASSIUM: 4.1 meq/L (ref 3.5–5.1)
SODIUM: 140 meq/L (ref 135–145)

## 2015-10-13 LAB — LIPASE: Lipase: 21 U/L (ref 11.0–59.0)

## 2015-10-13 NOTE — Progress Notes (Signed)
Pre visit review using our clinic review tool, if applicable. No additional management support is needed unless otherwise documented below in the visit note. 

## 2015-10-13 NOTE — Progress Notes (Signed)
   Subjective:    Patient ID: Ryan Dean, male    DOB: 08/10/1965, 50 y.o.   MRN: [id number]  HPI  Here with acute onset mild to mod bloating and crampy abd pains since Friday AM, wit intermittent nausea, thought might be constipatoin so tried laxative, but soon after started diarrhea with mult episodes over the weekend, no fever, no vomiting, or blood.  Denies worsening reflux, dysphagia. No prior similar illness before. Several people at work ill with similar symptoms, works in a program with special needs persons, at least 3 had to go home with GI illness, and one of those 3 is one he normally helps with transportation.  No diarrhea today and overall feeling a bit stronger, no n/v, fever, worsening pain. Or chills Past Medical History  Diagnosis Date  . Hypertriglyceridemia 11/22/2011  . Degenerative arthritis of left knee 11/22/2011  . Dyslipidemia 02/24/2014   Past Surgical History  Procedure Laterality Date  . Achilles tendon repair      left, 2004  . Appendectomy    . Hernia repair      umbilical as child    reports that he has quit smoking. He has never used smokeless tobacco. He reports that he drinks about 3.6 oz of alcohol per week. He reports that he does not use illicit drugs. family history includes Cancer in his father. No Known Allergies Current Outpatient Prescriptions on File Prior to Visit  Medication Sig Dispense Refill  . sildenafil (REVATIO) 20 MG tablet Take 3-5 tabs by mouth daily as needed 60 tablet 2   No current facility-administered medications on file prior to visit.   Review of Systems  All otherwise neg per pt     Objective:   Physical Exam BP 130/70 mmHg  Pulse 83  Temp(Src) 98.8 F (37.1 C) (Oral)  Resp 20  Wt 240 lb (108.863 kg)  SpO2 97% VS noted, not ill appearing Constitutional: Pt appears in no significant distress HENT: Head: NCAT.  Right Ear: External ear normal.  Left Ear: External ear normal.  Eyes: . Pupils are equal, round, and  reactive to light. Conjunctivae and EOM are normal Neck: Normal range of motion. Neck supple.  Cardiovascular: Normal rate and regular rhythm.   Pulmonary/Chest: Effort normal and breath sounds without rales or wheezing.  Abd:  Soft, ND, + BS - benign exam except for diffuse mild tender Neurological: Pt is alert. Not confused , motor grossly intact Skin: Skin is warm. No rash, no LE edema Psychiatric: Pt behavior is normal. No agitation.     Assessment & Plan:

## 2015-10-13 NOTE — Patient Instructions (Addendum)
OK to take Immodium OTC as needed for what appears to be a stomach viral illness  Please continue all other medications as before, and refills have been done if requested.  Please have the pharmacy call with any other refills you may need.  Please keep your appointments with your specialists as you may have planned  Please go to the LAB in the Basement (turn left off the elevator) for the tests to be done today  You will be contacted by phone if any changes need to be made immediately.  Otherwise, you will receive a letter about your results with an explanation, but please check with MyChart first.  Please remember to sign up for MyChart if you have not done so, as this will be important to you in the future with finding out test results, communicating by private email, and scheduling acute appointments online when needed.

## 2015-10-13 NOTE — Assessment & Plan Note (Signed)
With tenderness on exam not unexpected, but will check cbc, lipase, UA as well to r/o other, but most likely c/w viral illness, for rest, fluids, tylenol.  to f/u any worsening symptoms or concerns

## 2017-05-22 ENCOUNTER — Emergency Department (HOSPITAL_COMMUNITY)
Admission: EM | Admit: 2017-05-22 | Discharge: 2017-05-22 | Disposition: A | Discharge status: Home / Self Care | Payer: Self-pay | Attending: Emergency Medicine | Admitting: Emergency Medicine

## 2017-05-22 ENCOUNTER — Encounter (HOSPITAL_COMMUNITY): Payer: Self-pay | Admitting: Emergency Medicine

## 2017-05-22 ENCOUNTER — Emergency Department (HOSPITAL_COMMUNITY): Payer: Self-pay

## 2017-05-22 DIAGNOSIS — Z79899 Other long term (current) drug therapy: Secondary | ICD-10-CM | POA: Insufficient documentation

## 2017-05-22 DIAGNOSIS — R319 Hematuria, unspecified: Secondary | ICD-10-CM | POA: Insufficient documentation

## 2017-05-22 DIAGNOSIS — R109 Unspecified abdominal pain: Secondary | ICD-10-CM

## 2017-05-22 DIAGNOSIS — R1031 Right lower quadrant pain: Secondary | ICD-10-CM | POA: Insufficient documentation

## 2017-05-22 DIAGNOSIS — Z87891 Personal history of nicotine dependence: Secondary | ICD-10-CM | POA: Insufficient documentation

## 2017-05-22 LAB — COMPREHENSIVE METABOLIC PANEL
ALK PHOS: 75 U/L (ref 38–126)
ALT: 23 U/L (ref 17–63)
ANION GAP: 9 (ref 5–15)
AST: 28 U/L (ref 15–41)
Albumin: 4.5 g/dL (ref 3.5–5.0)
BUN: 13 mg/dL (ref 6–20)
CALCIUM: 9.6 mg/dL (ref 8.9–10.3)
CHLORIDE: 103 mmol/L (ref 101–111)
CO2: 26 mmol/L (ref 22–32)
CREATININE: 1.36 mg/dL — AB (ref 0.61–1.24)
GFR, EST NON AFRICAN AMERICAN: 60 mL/min — AB (ref >60)
Glucose, Bld: 104 mg/dL — ABNORMAL HIGH (ref 65–99)
Potassium: 3.7 mmol/L (ref 3.5–5.1)
SODIUM: 138 mmol/L (ref 135–145)
Total Bilirubin: 1.8 mg/dL — ABNORMAL HIGH (ref 0.3–1.2)
Total Protein: 8.3 g/dL — ABNORMAL HIGH (ref 6.5–8.1)

## 2017-05-22 LAB — CBC
HCT: 44.6 % (ref 39.0–52.0)
HEMOGLOBIN: 15.5 g/dL (ref 13.0–17.0)
MCH: 30.9 pg (ref 26.0–34.0)
MCHC: 34.8 g/dL (ref 30.0–36.0)
MCV: 88.8 fL (ref 78.0–100.0)
PLATELETS: 153 10*3/uL (ref 150–400)
RBC: 5.02 MIL/uL (ref 4.22–5.81)
RDW: 14 % (ref 11.5–15.5)
WBC: 5.9 10*3/uL (ref 4.0–10.5)

## 2017-05-22 LAB — URINALYSIS, ROUTINE W REFLEX MICROSCOPIC
BACTERIA UA: NONE SEEN
Bilirubin Urine: NEGATIVE
Glucose, UA: NEGATIVE mg/dL
Ketones, ur: 5 mg/dL — AB
Leukocytes, UA: NEGATIVE
Nitrite: NEGATIVE
PROTEIN: NEGATIVE mg/dL
SPECIFIC GRAVITY, URINE: 1.027 (ref 1.005–1.030)
pH: 5 (ref 5.0–8.0)

## 2017-05-22 LAB — SALICYLATE LEVEL

## 2017-05-22 LAB — ACETAMINOPHEN LEVEL

## 2017-05-22 LAB — ETHANOL

## 2017-05-22 NOTE — ED Provider Notes (Signed)
[facility]-EMERGENCY DEPT Provider Note   CSN: [id number] Arrival date & time: 05/22/17  1311     History   Chief Complaint Chief Complaint  Patient presents with  . Medical Clearance  . Flank Pain    HPI Ryan Dean is a 51 y.o. male.  HPI   51 year old male brought here via ems from [facility] for medical clearance.  Patient recently entered [facility] for suicidal ideation. He was noted to have elevated blood pressure by the staff and he also report having intermittent sharp pain to his right flank that started today. States the pain is like stabbing sensation, moderate, lasting for a few seconds and has recurrent possibly 3 separate times. No active pain at this time. Denies associated fever, chills, chest pain, shortness of breath, dysuria, hematuria, or rash. He denies any recent injury. Denies any recent medication changes. No prior history of kidney stones. He also denies history of hypertension. He has no other complaint.  Past Medical History:  Diagnosis Date  . Degenerative arthritis of left knee 11/22/2011  . Dyslipidemia 02/24/2014  . Hypertriglyceridemia 11/22/2011    Patient Active Problem List   Diagnosis Date Noted  . Acute gastroenteritis 10/13/2015  . Erectile dysfunction 07/25/2015  . Dyslipidemia 02/24/2014  . Preventative health care 11/22/2011  . Genital herpes 11/22/2011  . Degenerative arthritis of left knee 11/22/2011  . TB SKIN TEST, POSITIVE 04/18/2009    Past Surgical History:  Procedure Laterality Date  . ACHILLES TENDON REPAIR     left, 2004  . APPENDECTOMY    . HERNIA REPAIR     umbilical as child       Home Medications    Prior to Admission medications   Medication Sig Start Date End Date Taking? Authorizing Provider  sildenafil (REVATIO) 20 MG tablet Take 3-5 tabs by mouth daily as needed 07/25/15   Corwin Levins, MD    Family History Family History  Problem Relation Age of Onset  . Cancer Father      Social History Social History  Substance Use Topics  . Smoking status: Former Games developer  . Smokeless tobacco: Never Used  . Alcohol use 3.6 oz/week    6 Standard drinks or equivalent per week     Allergies   Patient has no known allergies.   Review of Systems Review of Systems  All other systems reviewed and are negative.    Physical Exam Updated Vital Signs BP (!) 170/111 (BP Location: Left Arm)   Pulse 83   Temp 98.6 F (37 C) (Oral)   Resp 18   SpO2 97%   Physical Exam  Constitutional: He appears well-developed and well-nourished. No distress.  HENT:  Head: Atraumatic.  Eyes: Conjunctivae are normal.  Neck: Neck supple.  Cardiovascular: Normal rate and regular rhythm.   Pulmonary/Chest: Effort normal and breath sounds normal. No respiratory distress. He has no wheezes. He has no rales.  Abdominal: Soft. Bowel sounds are normal. He exhibits no distension. There is tenderness (Mild tenderness to right lateral aspects of abdomen on palpation without guarding or rebound tenderness. Negative Murphy sign, no pain at McBurney's point, no bruising overlying skin changes.).  Genitourinary:  Genitourinary Comments: No CVA tenderness  Neurological: He is alert.  Skin: No rash noted.  Psychiatric: He has a normal mood and affect.  Nursing note and vitals reviewed.    ED Treatments / Results  Labs (all labs ordered are listed, but only abnormal results are displayed) Labs Reviewed  COMPREHENSIVE METABOLIC PANEL - Abnormal; Notable for the following:       Result Value   Glucose, Bld 104 (*)    Creatinine, Ser 1.36 (*)    Total Protein 8.3 (*)    Total Bilirubin 1.8 (*)    GFR calc non Af Amer 60 (*)    All other components within normal limits  ACETAMINOPHEN LEVEL - Abnormal; Notable for the following:    Acetaminophen (Tylenol), Serum <10 (*)    All other components within normal limits  URINALYSIS, ROUTINE W REFLEX MICROSCOPIC - Abnormal; Notable for the  following:    Hgb urine dipstick MODERATE (*)    Ketones, ur 5 (*)    Squamous Epithelial / LPF 0-5 (*)    All other components within normal limits  ETHANOL  SALICYLATE LEVEL  CBC    EKG  EKG Interpretation None       Radiology Ct Renal Stone Study  Result Date: 05/22/2017 CLINICAL DATA:  Pt sent here from [facility] for medical clearance. Pt told [facility] he had a few episodes where his right flank hurt for a few seconds before spontaneously going away. EXAM: CT ABDOMEN AND PELVIS WITHOUT CONTRAST TECHNIQUE: Multidetector CT imaging of the abdomen and pelvis was performed following the standard protocol without IV contrast. COMPARISON:  None. FINDINGS: Lower chest: No acute abnormality. Hepatobiliary: No focal liver abnormality is seen. No gallstones, gallbladder wall thickening, or biliary dilatation. Pancreas: Unremarkable. No pancreatic ductal dilatation or surrounding inflammatory changes. Spleen: Normal in size without focal abnormality. Adrenals/Urinary Tract: Adrenal glands are unremarkable. Kidneys are normal, without renal calculi, focal lesion, or hydronephrosis. Bladder is unremarkable. Stomach/Bowel: Stomach is within normal limits. Appendix appears normal. No evidence of bowel wall thickening, distention, or inflammatory changes. Diverticulosis without evidence of diverticulitis. Vascular/Lymphatic: Normal caliber abdominal aorta. Mild abdominal aortic atherosclerosis. No enlarged abdominal or pelvic lymph nodes. Reproductive: Prostate is unremarkable. Other: No abdominal wall hernia or abnormality. No abdominopelvic ascites. Musculoskeletal: No acute or significant osseous findings. 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 Broad-based disc osteophyte complex at L1-2, L2-3, L4-5 and L5-S1. IMPRESSION: 1. No acute abdominal or pelvic pathology. 2. No urolithiasis or obstructive uropathy. Electronically Signed   By:  Elige Ko   On: 05/22/2017 15:33    Procedures Procedures (including critical care time)  Medications Ordered in ED Medications - No data to display   Initial Impression / Assessment and Plan / ED Course  I have reviewed the triage vital signs and the nursing notes.  Pertinent labs & imaging results that were available during my care of the patient were reviewed by me and considered in my medical decision making (see chart for details).     BP (!) 137/96   Pulse 79   Temp 98.6 F (37 C) (Oral)   Resp 16   SpO2 100%    Final Clinical Impressions(s) / ED Diagnoses   Final diagnoses:  Right flank pain  Hematuria, unspecified type    New Prescriptions New Prescriptions   No medications on file   1:37 PM Patient sent here from [facility] for medical clearance. He reports some intermittent pain to the right side of his abdomen. Pain has resolved. He does have some mild discomfort on palpation but no overlying skin changes. He was noted that his blood pressure was elevated. Currently his blood pressure is 170/111. Will recheck. We'll perform screening exam.  3:00 PM UA with moderate hemoglobin on urine  dipsticks, will obtain renal stone study to rule out kidney stones.  4:14 PM Urine with evidence of hemoglobin on urine dipstick. A renal CT stone study was obtained without any signs of renal pathology. No obstructive kidney stone. No other concerning feature. At this time patient is medically cleared [facility] of this psychiatric illness. Repeat blood pressure is within normal limits.Recommend pt to f/u with [facility] for further evaluation of his hematuria.    Fayrene Helperran, Gara Kincade, PA-C 05/22/17 1618    Tilden Fossaees, Elizabeth, MD 05/23/17 [phone]

## 2017-05-22 NOTE — ED Notes (Signed)
Bed: WLPT3 Expected date:  Expected time:  Means of arrival:  Comments: 

## 2017-05-22 NOTE — ED Triage Notes (Addendum)
Per EMS. [facility]. Pt told [facility] he had a few episodes where his R flank hurt for a few seconds before spontaneously going away. [facility] also was concerned that pt's BP was 150/110 at the facility.  Pt denied any complaints to EMS. [facility]. [facility], but none here.

## 2017-05-22 NOTE — Discharge Instructions (Signed)
You have been evaluated for your right flank pain. There's some blood in your urine, however CT scan did not show any evidence of kidney stone.  Please follow up with urologist for further evaluation of your kidney function.

## 2018-12-02 IMAGING — CT CT RENAL STONE PROTOCOL
2 of 3 series · 16 of 46 positions shown, 18 images · non-contrast
Comparison: None.

CLINICAL DATA: Pt sent here from [REDACTED] for medical clearance. Pt
told [REDACTED] he had a few episodes where his right flank hurt for a
few seconds before spontaneously going away.

EXAM:
CT ABDOMEN AND PELVIS WITHOUT CONTRAST
TECHNIQUE: Multidetector CT imaging of the abdomen and pelvis was performed
following the standard protocol without IV contrast.

[Series 3: lung · axial · 0.82mm/px · z∈[-104,-26]mm · 13 of 45 slices shown, 15 images]
[im 3/45  soft-tissue]
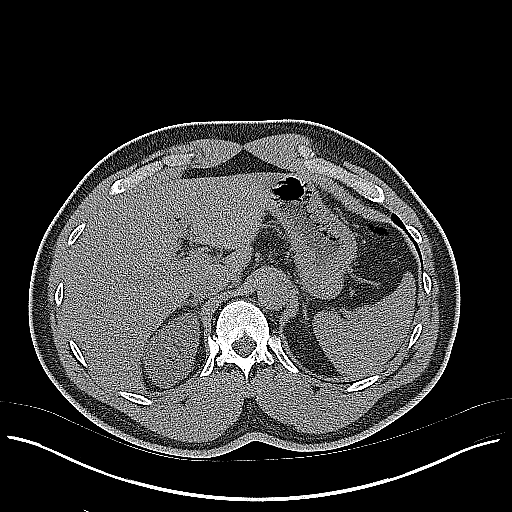
[im 3/45  bone]
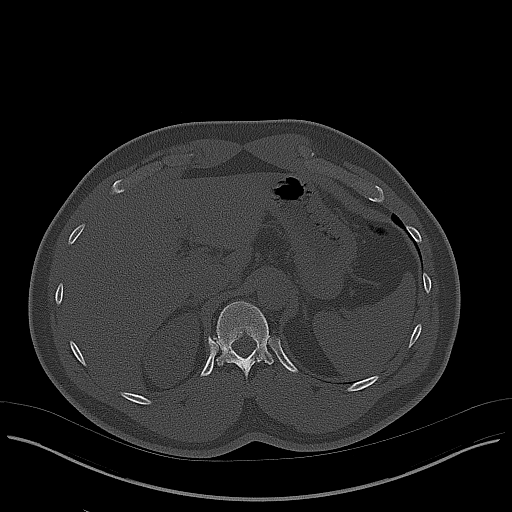
[im 6/45  soft-tissue]
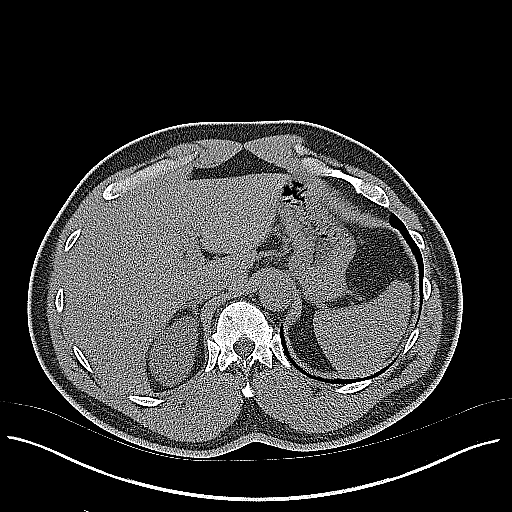
[im 9/45  soft-tissue]
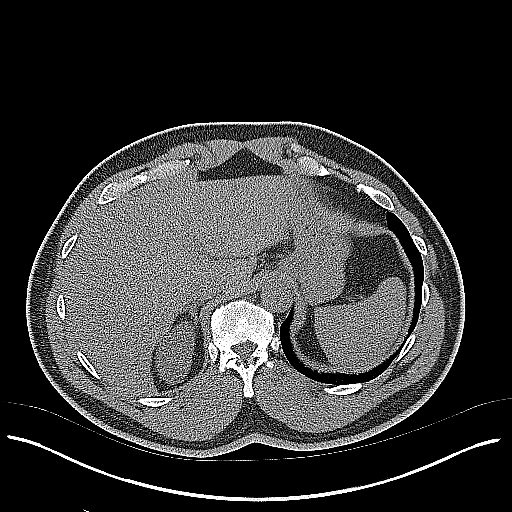
[im 13/45  soft-tissue]
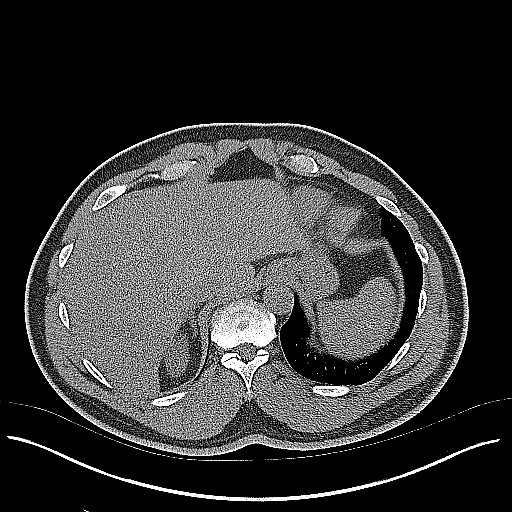
[im 16/45  soft-tissue]
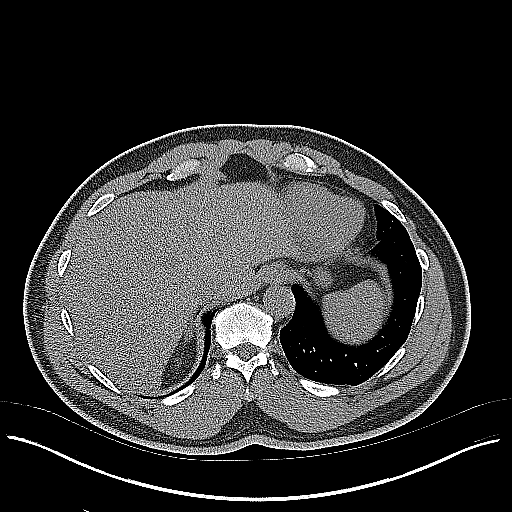
[im 19/45  soft-tissue]
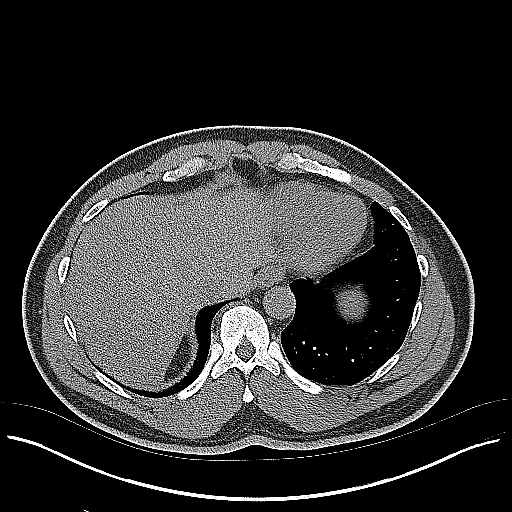
[im 23/45  soft-tissue]
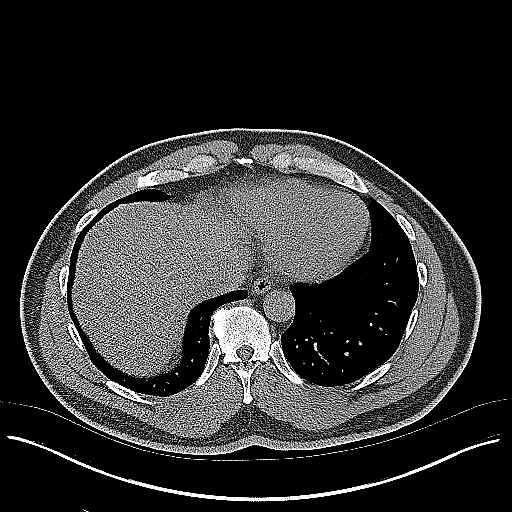
[im 26/45  soft-tissue]
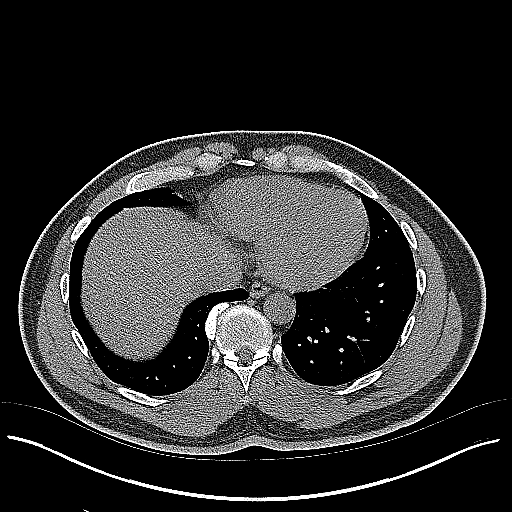
[im 29/45  soft-tissue]
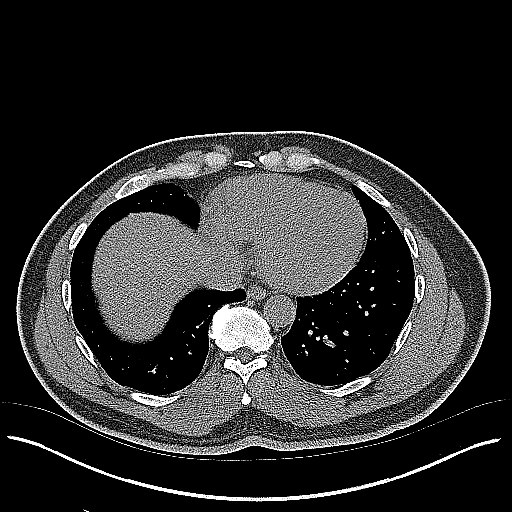
[im 29/45  bone]
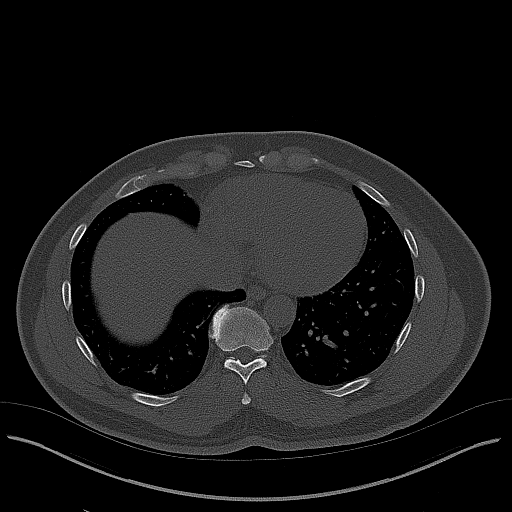
[im 32/45  soft-tissue]
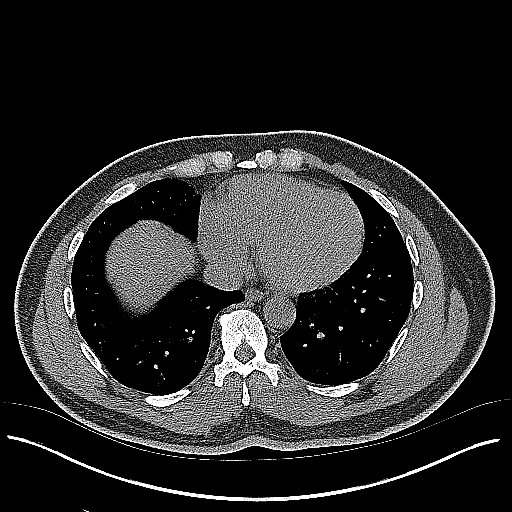
[im 36/45  soft-tissue]
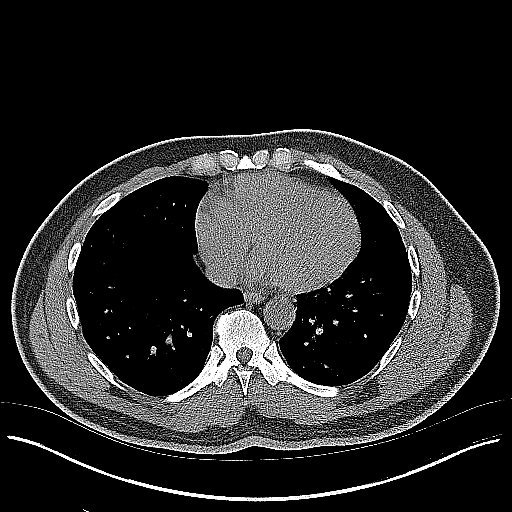
[im 39/45  soft-tissue]
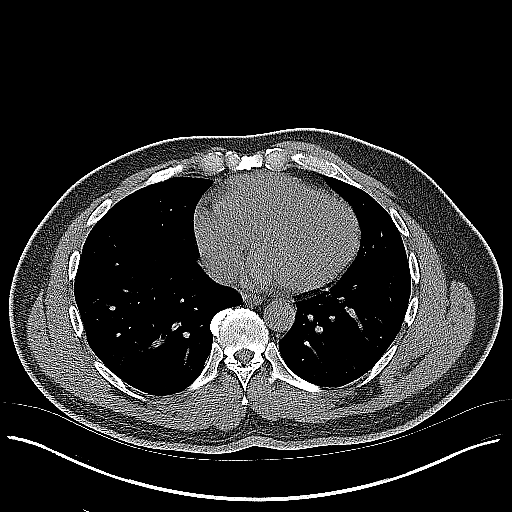
[im 42/45  soft-tissue]
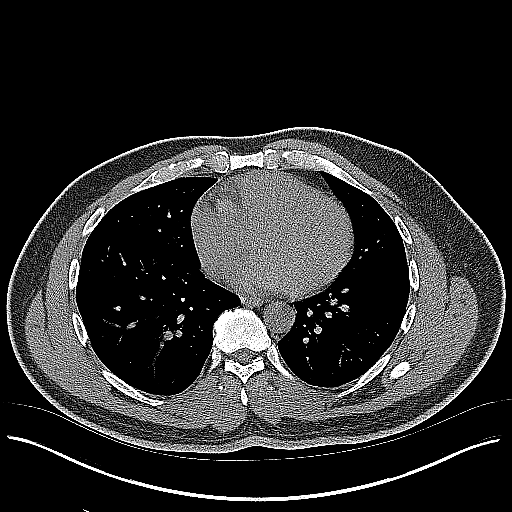

[Series 4: coronal · coronal · 0.75mm/px · 3 of 129 slices shown]
[im 43/129  soft-tissue]
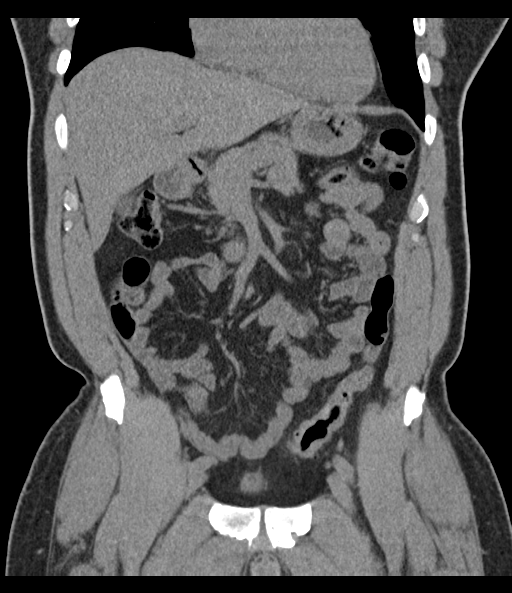
[im 57/129  soft-tissue]
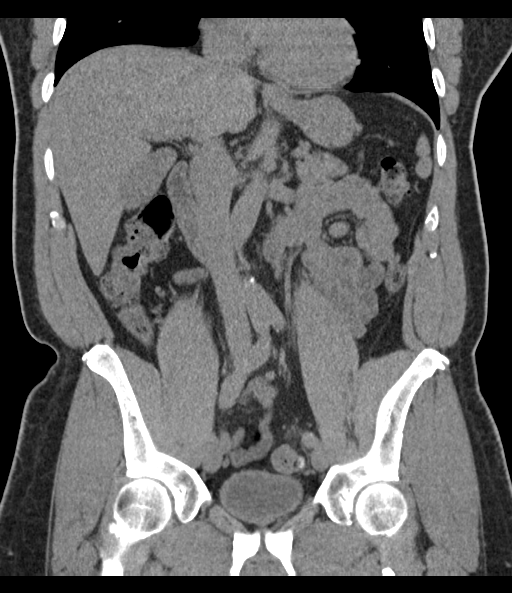
[im 72/129  soft-tissue]
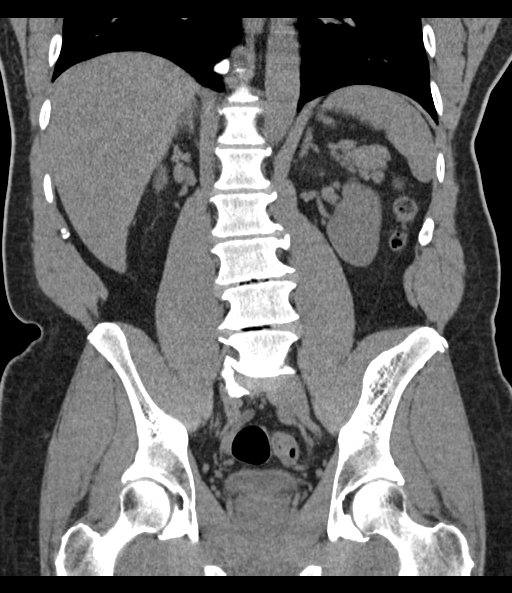

[16 of 46 positions shown; findings below may reference images not displayed]

FINDINGS: Lower chest: No acute abnormality.

Hepatobiliary: No focal liver abnormality is seen. No gallstones,
gallbladder wall thickening, or biliary dilatation.

Pancreas: Unremarkable. No pancreatic ductal dilatation or
surrounding inflammatory changes.

Spleen: Normal in size without focal abnormality.

Adrenals/Urinary Tract: Adrenal glands are unremarkable. Kidneys are
normal, without renal calculi, focal lesion, or hydronephrosis.
Bladder is unremarkable.

Stomach/Bowel: Stomach is within normal limits. Appendix appears
normal. No evidence of bowel wall thickening, distention, or
inflammatory changes. Diverticulosis without evidence of
diverticulitis.

Vascular/Lymphatic: Normal caliber abdominal aorta. Mild abdominal
aortic atherosclerosis. No enlarged abdominal or pelvic lymph nodes.

Reproductive: Prostate is unremarkable.

Other: No abdominal wall hernia or abnormality. No abdominopelvic
ascites.

Musculoskeletal: No acute or significant osseous findings.
Degenerative disc disease with disc height loss throughout the
lumbar spine with bilateral facet arthropathy. Broad-based disc
osteophyte complex at L1-2, L2-3, L4-5 and L5-S1.
IMPRESSION: 1. No acute abdominal or pelvic pathology.
2. No urolithiasis or obstructive uropathy.

## 2020-01-09 ENCOUNTER — Encounter (HOSPITAL_COMMUNITY): Payer: Self-pay

## 2020-01-09 ENCOUNTER — Ambulatory Visit (HOSPITAL_COMMUNITY)
Admission: EM | Admit: 2020-01-09 | Discharge: 2020-01-09 | Disposition: A | Discharge status: Home / Self Care | Payer: Non-veteran care | Attending: Family Medicine | Admitting: Family Medicine

## 2020-01-09 ENCOUNTER — Other Ambulatory Visit: Payer: Self-pay

## 2020-01-09 DIAGNOSIS — M79605 Pain in left leg: Secondary | ICD-10-CM | POA: Diagnosis not present

## 2020-01-09 MED ORDER — DICLOFENAC SODIUM 75 MG PO TBEC: 75.0000 mg | DELAYED_RELEASE_TABLET | Freq: Two times a day (BID) | ORAL | 0 refills | Status: AC

## 2020-01-09 NOTE — ED Provider Notes (Signed)
Ryan Dean   [id number] 01/09/20 Arrival Time: 3220  ASSESSMENT & PLAN:  1. Left leg pain     No indication for plain imaging of left ankle at this time.  Begin trial of: Meds ordered this encounter  Medications  . diclofenac (VOLTAREN) 75 MG EC tablet    Sig: Take 1 tablet (75 mg total) by mouth 2 (two) times daily.    Dispense:  14 tablet    Refill:  0    Orders Placed This Encounter  Procedures  . Apply CAM boot   Work note provided.  Recommend: Follow-up Information    Ryan Dean.   Why: If worsening or failing to improve as anticipated. Contact information: [address] [place] [phone]          Reviewed expectations re: course of current medical issues. Questions answered. Outlined signs and symptoms indicating need for more acute intervention. Patient verbalized understanding. After Visit Summary given.  SUBJECTIVE: History from: patient. Ryan Dean is a 54 y.o. male who reports pain over left leg over Achilles tendon. Distant h/o Achilles tendon rupture repair. H/O similar pain and swelling several years ago. Resolved with rest/ice. Work now prevents him from resting it. No new injury. No pops before pain/swelling. Is able to ambulate but with reported discomfort. No calf pain or swelling. No skin changes. No extremity sensation changes or weakness. Tylenol without much change.    Past Surgical History:  Procedure Laterality Date  . ACHILLES TENDON REPAIR     left, 2004  . APPENDECTOMY    . HERNIA REPAIR     umbilical as child      OBJECTIVE:  Vitals:   01/09/20 1205  BP: 123/88  Pulse: 69  Resp: 20  Temp: 98.2 F (36.8 C)  TempSrc: Oral  SpO2: 100%    General appearance: alert; no distress HEENT: [place]; AT Neck: supple with FROM Resp: unlabored respirations Extremities: . LLE: warm with well perfused appearance; healed scar over Achilles; with swelling  and significant TTP over Achilles insertion; "feels tight" with dorsiflexion; no bruising Skin: warm and dry; no visible rashes Neurologic: gait normal but favors LLE; normal sensation and strength of LLE Psychological: alert and cooperative; normal mood and affect  Imaging: No results found.    No Known Allergies  Past Medical History:  Diagnosis Date  . Degenerative arthritis of left knee 11/22/2011  . Dyslipidemia 02/24/2014  . Hypertriglyceridemia 11/22/2011   Social History   Socioeconomic History  . Marital status: Divorced    Spouse name: Not on file  . Number of children: Not on file  . Years of education: Not on file  . Highest education level: Not on file  Occupational History  . Not on file  Tobacco Use  . Smoking status: Former Research scientist (life sciences)  . Smokeless tobacco: Never Used  Substance and Sexual Activity  . Alcohol use: Yes    Alcohol/week: 6.0 standard drinks    Types: 6 Standard drinks or equivalent per week  . Drug use: No  . Sexual activity: Not on file  Other Topics Concern  . Not on file  Social History Narrative  . Not on file   Social Determinants of Health   Financial Resource Strain:   . Difficulty of Paying Living Expenses:   Food Insecurity:   . Worried About Charity fundraiser in the Last Year:   . [place] in the Last Year:  Transportation Needs:   . Freight forwarder (Medical):   Marland Kitchen Lack of Transportation (Non-Medical):   Physical Activity:   . Days of Exercise per Week:   . Minutes of Exercise per Session:   Stress:   . Feeling of Stress :   Social Connections:   . Frequency of Communication with Friends and Family:   . Frequency of Social Gatherings with Friends and Family:   . Attends Religious Services:   . Active Member of Clubs or Organizations:   . Attends Banker Meetings:   Marland Kitchen Marital Status:    Family History  Problem Relation Age of Onset  . Cancer Father    Past Surgical History:  Procedure  Laterality Date  . ACHILLES TENDON REPAIR     left, 2004  . APPENDECTOMY    . HERNIA REPAIR     umbilical as child      Mardella Layman, MD 01/09/20 1343

## 2020-01-09 NOTE — ED Triage Notes (Signed)
Pt reports pain above the left ankle x 2 days approx. Pain is worse when walking.

## 2020-05-06 ENCOUNTER — Emergency Department (HOSPITAL_COMMUNITY)
Admission: EM | Admit: 2020-05-06 | Discharge: 2020-05-06 | Disposition: A | Discharge status: Home / Self Care | Payer: No Typology Code available for payment source | Attending: Emergency Medicine | Admitting: Emergency Medicine

## 2020-05-06 ENCOUNTER — Other Ambulatory Visit: Payer: Self-pay

## 2020-05-06 ENCOUNTER — Encounter (HOSPITAL_COMMUNITY): Payer: Self-pay | Admitting: Emergency Medicine

## 2020-05-06 DIAGNOSIS — I10 Essential (primary) hypertension: Secondary | ICD-10-CM | POA: Diagnosis not present

## 2020-05-06 DIAGNOSIS — Z20822 Contact with and (suspected) exposure to covid-19: Secondary | ICD-10-CM | POA: Diagnosis not present

## 2020-05-06 DIAGNOSIS — R5083 Postvaccination fever: Secondary | ICD-10-CM | POA: Insufficient documentation

## 2020-05-06 DIAGNOSIS — Z79899 Other long term (current) drug therapy: Secondary | ICD-10-CM | POA: Diagnosis not present

## 2020-05-06 DIAGNOSIS — R509 Fever, unspecified: Secondary | ICD-10-CM | POA: Diagnosis present

## 2020-05-06 DIAGNOSIS — Z87891 Personal history of nicotine dependence: Secondary | ICD-10-CM | POA: Insufficient documentation

## 2020-05-06 LAB — RESPIRATORY PANEL BY RT PCR (FLU A&B, COVID)
Influenza A by PCR: NEGATIVE
Influenza B by PCR: NEGATIVE
SARS Coronavirus 2 by RT PCR: NEGATIVE

## 2020-05-06 LAB — CBC WITH DIFFERENTIAL/PLATELET
Abs Immature Granulocytes: 0.06 10*3/uL (ref 0.00–0.07)
Basophils Absolute: 0.1 10*3/uL (ref 0.0–0.1)
Basophils Relative: 1 %
Eosinophils Absolute: 0 10*3/uL (ref 0.0–0.5)
Eosinophils Relative: 0 %
HCT: 45.3 % (ref 39.0–52.0)
Hemoglobin: 14.3 g/dL (ref 13.0–17.0)
Immature Granulocytes: 1 %
Lymphocytes Relative: 13 %
Lymphs Abs: 0.9 10*3/uL (ref 0.7–4.0)
MCH: 29.5 pg (ref 26.0–34.0)
MCHC: 31.6 g/dL (ref 30.0–36.0)
MCV: 93.6 fL (ref 80.0–100.0)
Monocytes Absolute: 0.6 10*3/uL (ref 0.1–1.0)
Monocytes Relative: 8 %
Neutro Abs: 5.3 10*3/uL (ref 1.7–7.7)
Neutrophils Relative %: 77 %
Platelets: 158 10*3/uL (ref 150–400)
RBC: 4.84 MIL/uL (ref 4.22–5.81)
RDW: 13.7 % (ref 11.5–15.5)
WBC: 6.9 10*3/uL (ref 4.0–10.5)
nRBC: 0 % (ref 0.0–0.2)

## 2020-05-06 LAB — BASIC METABOLIC PANEL
Anion gap: 11 (ref 5–15)
BUN: 16 mg/dL (ref 6–20)
CO2: 23 mmol/L (ref 22–32)
Calcium: 8.9 mg/dL (ref 8.9–10.3)
Chloride: 98 mmol/L (ref 98–111)
Creatinine, Ser: 1.41 mg/dL — ABNORMAL HIGH (ref 0.61–1.24)
GFR calc non Af Amer: 57 mL/min — ABNORMAL LOW (ref >60)
Glucose, Bld: 111 mg/dL — ABNORMAL HIGH (ref 70–99)
Potassium: 3.5 mmol/L (ref 3.5–5.1)
Sodium: 132 mmol/L — ABNORMAL LOW (ref 135–145)

## 2020-05-06 MED ORDER — SODIUM CHLORIDE 0.9 % IV BOLUS
1000.0000 mL | Freq: Once | INTRAVENOUS | Status: AC
  Administered 2020-05-06: 1000 mL via INTRAVENOUS

## 2020-05-06 MED ORDER — ACETAMINOPHEN 325 MG PO TABS
650.0000 mg | ORAL_TABLET | Freq: Once | ORAL | Status: AC
  Administered 2020-05-06: 650 mg via ORAL
  Filled 2020-05-06: qty 2

## 2020-05-06 NOTE — ED Triage Notes (Signed)
Pt reports having chills fever generalized body aches after receiving his second dose of the shingles vaccine today

## 2020-05-06 NOTE — ED Provider Notes (Signed)
[name] Provider Note   CSN: [id number] Arrival date & time: 05/06/20  0111     History Chief Complaint  Patient presents with  . Chills  . Fever    Ryan Dean is a 54 y.o. male with a past medical history of hypertension presenting to the ED with a chief complaint of fever, chills and body aches after receiving second shingles vaccine yesterday morning.  Has not taken any medications to help with symptoms.  Reports nausea and generalized abdominal pain.  Denies any chest pain, cough, shortness of breath.  Did not have similar symptoms with the first shingles vaccination.  Denies any Covid exposure and has been vaccinated against Covid.  Denies any vision changes, vomiting, numbness in arms or legs.  He does report a slight headache that has gradually resolved.  HPI     Past Medical History:  Diagnosis Date  . Degenerative arthritis of left knee 11/22/2011  . Dyslipidemia 02/24/2014  . Hypertriglyceridemia 11/22/2011    Patient Active Problem List   Diagnosis Date Noted  . Acute gastroenteritis 10/13/2015  . Erectile dysfunction 07/25/2015  . Dyslipidemia 02/24/2014  . Preventative health care 11/22/2011  . Genital herpes 11/22/2011  . Degenerative arthritis of left knee 11/22/2011  . TB SKIN TEST, POSITIVE 04/18/2009    Past Surgical History:  Procedure Laterality Date  . ACHILLES TENDON REPAIR     left, 2004  . APPENDECTOMY    . HERNIA REPAIR     umbilical as child       Family History  Problem Relation Age of Onset  . Cancer Father     Social History   Tobacco Use  . Smoking status: Former Games developer  . Smokeless tobacco: Never Used  Substance Use Topics  . Alcohol use: Yes    Alcohol/week: 6.0 standard drinks    Types: 6 Standard drinks or equivalent per week  . Drug use: No    Home Medications Prior to Admission medications   Medication Sig Start Date End Date Taking? Authorizing Provider  diclofenac  (VOLTAREN) 75 MG EC tablet Take 1 tablet (75 mg total) by mouth 2 (two) times daily. 01/09/20   Mardella Layman, MD  lisinopril (ZESTRIL) 10 MG tablet Take by mouth.    [provider]    Allergies    Patient has no allergy information on record.  Review of Systems   Review of Systems  Constitutional: Positive for chills and fever. Negative for appetite change.  HENT: Negative for ear pain, rhinorrhea, sneezing and sore throat.   Eyes: Negative for photophobia and visual disturbance.  Respiratory: Negative for cough, chest tightness, shortness of breath and wheezing.   Cardiovascular: Negative for chest pain and palpitations.  Gastrointestinal: Positive for nausea. Negative for abdominal pain, blood in stool, constipation, diarrhea and vomiting.  Genitourinary: Negative for dysuria, hematuria and urgency.  Musculoskeletal: Positive for myalgias.  Skin: Negative for rash.  Neurological: Negative for dizziness, weakness and light-headedness.    Physical Exam Updated Vital Signs BP 123/81   Pulse 87   Temp 99.7 F (37.6 C) (Oral)   Resp 18   Ht 5\' 11"  (1.803 m)   Wt 111.1 kg   SpO2 99%   BMI 34.17 kg/m   Physical Exam Vitals and nursing note reviewed.  Constitutional:      General: He is not in acute distress.    Appearance: He is well-developed.  HENT:     Head: Normocephalic and atraumatic.  Nose: Nose normal.  Eyes:     General: No scleral icterus.       Left eye: No discharge.     Conjunctiva/sclera: Conjunctivae normal.  Cardiovascular:     Rate and Rhythm: Normal rate and regular rhythm.     Heart sounds: Normal heart sounds. No murmur heard.  No friction rub. No gallop.   Pulmonary:     Effort: Pulmonary effort is normal. No respiratory distress.     Breath sounds: Normal breath sounds.  Abdominal:     General: Bowel sounds are normal. There is no distension.     Palpations: Abdomen is soft.     Tenderness: There is no abdominal tenderness. There is  no guarding.  Musculoskeletal:        General: Normal range of motion.     Cervical back: Normal range of motion and neck supple.  Skin:    General: Skin is warm and dry.     Findings: No rash.  Neurological:     Mental Status: He is alert.     Motor: No abnormal muscle tone.     Coordination: Coordination normal.     ED Results / Procedures / Treatments   Labs (all labs ordered are listed, but only abnormal results are displayed) Labs Reviewed  BASIC METABOLIC PANEL - Abnormal; Notable for the following components:      Result Value   Sodium 132 (*)    Glucose, Bld 111 (*)    Creatinine, Ser 1.41 (*)    GFR calc non Af Amer 57 (*)    All other components within normal limits  RESPIRATORY PANEL BY RT PCR (FLU A&B, COVID)  CBC WITH DIFFERENTIAL/PLATELET    EKG None  Radiology No results found.  Procedures Procedures (including critical care time)  Medications Ordered in ED Medications  sodium chloride 0.9 % bolus 1,000 mL (1,000 mLs Intravenous New Bag/Given 05/06/20 0817)  acetaminophen (TYLENOL) tablet 650 mg (650 mg Oral Given 05/06/20 4765)    ED Course  I have reviewed the triage vital signs and the nursing notes.  Pertinent labs & imaging results that were available during my care of the patient were reviewed by me and considered in my medical decision making (see chart for details).    MDM Rules/Calculators/A&P                          [name] was evaluated in Emergency Department on 05/06/20  for the symptoms described in the history of present illness. He/she was evaluated in the context of the global COVID-19 pandemic, which necessitated consideration that the patient might be at risk for infection with the SARS-CoV-2 virus that causes COVID-19. Institutional protocols and algorithms that pertain to the evaluation of patients at risk for COVID-19 are in a state of rapid change based on information released by regulatory bodies including the CDC and  federal and state organizations. These policies and algorithms were followed during the patient's care in the ED.  54yo M presenting to the ED with a chief complaint of chills, fever, myalgias, nausea and headache since receiving second shingles vaccine yesterday morning.  Patient tachycardic and febrile on arrival here.  This improved without intervention.  He denies any respiratory symptoms.  Abdomen is soft, nontender nondistended.  Will obtain lab work, give IV fluids, Tylenol and reassess.  Low likelihood of Covid as he denies any new exposures and is fully vaccinated.  Lab work including CBC,  BMP unremarkable.  Creatinine similar to priors.  Covid test is pending.  He remains in no acute distress and vital signs are within normal limits.  Tachycardia has improved with IV fluids and defervescence.  We will have him follow-up with the results of Covid test however I believe this is more likely due to his recent vaccination.  Patient with improvement here with medications given.  Return precautions given.   Patient is hemodynamically stable, in NAD, and able to ambulate in the ED. Evaluation does not show pathology that would require ongoing emergent intervention or inpatient treatment. I explained the diagnosis to the patient. Pain has been managed and has no complaints prior to discharge. Patient is comfortable with above plan and is stable for discharge at this time. All questions were answered prior to disposition. Strict return precautions for returning to the ED were discussed. Encouraged follow up with PCP.   An After Visit Summary was printed and given to the patient.   Portions of this note were generated with Scientist, clinical (histocompatibility and immunogenetics). Dictation errors may occur despite best attempts at proofreading.  Final Clinical Impression(s) / ED Diagnoses Final diagnoses:  Post-vaccination fever    Rx / DC Orders ED Discharge Orders    None       Dietrich Pates, PA-C 05/06/20 7846     Alvira Monday, MD 05/06/20 2245

## 2020-05-06 NOTE — Discharge Instructions (Addendum)
Make sure you are drinking plenty of fluids, take Tylenol Motrin as needed. Your COVID and flu tests were negative today. We will contact you with the results of your Covid test if it is positive.  You can also check online on MyChart. Follow-up with your primary care provider. Return to the ER if you start to experience chest pain, shortness of breath, blurry vision, numbness in arms or legs.
